# Patient Record
Sex: Male | Born: 1965 | Race: Black or African American | Hispanic: No | Marital: Single | Smoking: Current every day smoker
Health system: Southern US, Community
[De-identification: ages and names within clinical notes are randomized; demographics above are authoritative.]

## PROBLEM LIST (undated history)

## (undated) HISTORY — PX: FINGER AMPUTATION: SHX636

---

## 2000-08-31 ENCOUNTER — Encounter: Payer: Self-pay | Admitting: Emergency Medicine

## 2000-08-31 ENCOUNTER — Observation Stay (HOSPITAL_COMMUNITY): Admission: EM | Admit: 2000-08-31 | Discharge: 2000-08-31 | Payer: Self-pay | Admitting: Emergency Medicine

## 2003-12-18 ENCOUNTER — Emergency Department (HOSPITAL_COMMUNITY): Admission: EM | Admit: 2003-12-18 | Discharge: 2003-12-18 | Payer: Self-pay | Admitting: Emergency Medicine

## 2003-12-31 ENCOUNTER — Emergency Department (HOSPITAL_COMMUNITY): Admission: EM | Admit: 2003-12-31 | Discharge: 2003-12-31 | Payer: Self-pay | Admitting: Emergency Medicine

## 2004-05-19 ENCOUNTER — Emergency Department (HOSPITAL_COMMUNITY): Admission: EM | Admit: 2004-05-19 | Discharge: 2004-05-19 | Payer: Self-pay | Admitting: Emergency Medicine

## 2005-08-27 ENCOUNTER — Emergency Department (HOSPITAL_COMMUNITY): Admission: EM | Admit: 2005-08-27 | Discharge: 2005-08-27 | Payer: Self-pay | Admitting: Family Medicine

## 2006-01-31 ENCOUNTER — Emergency Department (HOSPITAL_COMMUNITY): Admission: EM | Admit: 2006-01-31 | Discharge: 2006-01-31 | Payer: Self-pay | Admitting: Family Medicine

## 2009-08-17 ENCOUNTER — Emergency Department (HOSPITAL_COMMUNITY): Admission: EM | Admit: 2009-08-17 | Discharge: 2009-08-17 | Payer: Self-pay | Admitting: Family Medicine

## 2010-08-25 NOTE — Op Note (Signed)
[place]. [facility]  Patient:    Jeremy Snow, Jeremy Snow                    MRN: [id number] Proc. Date: 08/31/00 Adm. 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  POSTOPERATIVE DIAGNOSIS:  Amputation right small finger at the P3/digital interphalangeal level.  OPERATION: 1. Incision and drainage skin, subcutaneous tissue, bone, and tendon    tissue, right small finger.  This was an excisional debridement with    removal of particulate matter. 2. Revision of amputation right small finger with bilateral neurectomies    and bony removal and contouring, etc.  SURGEON:  Aron Baba, M.D.  ASSISTANT:  None.  COMPLICATIONS:  None.  ANESTHESIA:  General.  TOURNIQUET TIME:  Less than 1 hour.  DRAINS:  None.  ESTIMATED BLOOD LOSS:  Less than 20 cc.  INDICATIONS FOR PROCEDURE:  This patient is a 45 year old male who presents with the above-mentioned diagnosis.  I have counselled him regarding risks and benefits of surgery including risk of infection, bleeding, anesthesia, damage to normal structures, and failure of surgery to accomplish intended goals of relieving symptoms and restoring function.  With this in mind, he desires to proceed.  All questions have been encouraged and answered preoperatively.  The patient and I have discussed his diagnosis.  At the present time, he had an amputated tip.  This is in very poor repair.  There is a large amount of grass, debris, and contaminants in the tip.  I have viewed the tip myself in the ER under 4.0 loupe magnification.  I see poor quality tissue, crush injury, and contusion to the vessels.  I did not think this was a viable candidate for replantation but will look at this in the operative suite under the microscope.  If it is deemed  salvageable for replantation, the patient does express desire to try this.  Thus, we will proceed with this completion/revision amputation versus reattachment as dictated by distal tip conditions.  I have discussed with the patient at length that I did feel that he will likely need an amputation as I feel the tip is very poor quality.  All questions have been encouraged and answered.  OPERATIVE FINDINGS:  This patient underwent revision of amputation due to the fact that the distal tip is not a suitable candidate for replantation.  The distal tip was contaminated, fragmented, and contused with crush injury to the vessels.  This was noted by the avulsion of the digital neurovascular bundles that were still somewhat attached to the proximal stump and certainly detached from the distal stump, leaving no vessels to tie into distally about the amputated part.  This was not in any way a suitable piece for replantation. It was examined under the microscope.  DESCRIPTION OF PROCEDURE IN DETAIL:  The patient was seen by myself in anesthesia, taken to the operating suite, and underwent smooth induction of anesthesia.  Appropriately padded and then prepped and draped in the usual sterile fashion.  Following this, I took the additional portion of the tip that was amputated and looked at it under the microscope.  It was debrided.  I examined the soft tissue and vessel  architecture.  The patient did not have any suitable vessels to tie into.  There was avulsion of the vessels, poor quality, dirty contaminants, and a crush injury throughout the distal tip. This was obviously not a candidate for replantation.  Following this, the patient had sterile field secured about the right upper extremity which had been prepped and draped with Betadine scrub and Betadine painting and sterile field secured with towels.  The right upper extremity underwent an I&D of skin and subcutaneous tissue and bone.  The I&D  was accomplished with scissor tip and knife blade.  I did remove the distal portion of P3.  There was a very small amount attached to the P3 bone.  The flexor tendon was debrided as well.  There was a large amount of grass, dirt, and soft tissue contaminants throughout the distal finger.  This was quite impressive.  He underwent meticulous debridement without difficulty including skin, subcutaneous tissue, bone, and tendinous structures.  Following this, the patient had removal of the distal P3 remnant.  Flexor tendon was pulled distally, severed, and allowed to retract proximally.  This was the FDP.  Following this, bilateral neurectomies were performed with crushing and cauterization technique, and the vessels were treated with cautery.  A defatting technique was performed and sculpting of the distal tip accomplished with knife blade and scissor tip under 4.0 loupe magnification. The patient tolerated this well.  The patient then had the wound copiously irrigated and tourniquet deflated.  I should not the copious irrigation was applied throughout the course of the operation and during the I&D.  With the I&D completed, the patient then underwent closure of the amputation.  Skin flaps were created so that there would not be any redundant skin edges, and the wound closed nicely.  There was good refill when the tourniquet was down. Following this, the patient had sterile compressive dressing placed without difficulty and a sterile wrap placed.  He tolerated the procedure well without difficulty.  I should note that we did try to remove off the terminal matrix attached to the distal finger so as to not grow a nail remnant; however, there is no guarantee that he might grow a portion of the nail remnant, etc.  The patient was awakened from anesthesia and transferred to the recovery room in stable condition.  All sponge, needle, and instrument counts were reported as correct, and there were no  complications.  I have discussed all findings with his family.  He will be discharged home on Keflex, Percocet, and Robaxin for pain and muscle spasm.  He will follow up in  10 to 14 days and notify us should any problems, questions, concerns, or abrupt changes occur in his examination. DD:  08/31/00 TD:  09/01/00 Job: [id number]

## 2012-04-02 ENCOUNTER — Emergency Department (HOSPITAL_COMMUNITY)
Admission: EM | Admit: 2012-04-02 | Discharge: 2012-04-02 | Disposition: A | Discharge status: Home / Self Care | Payer: Self-pay | Attending: Emergency Medicine | Admitting: Emergency Medicine

## 2012-04-02 ENCOUNTER — Emergency Department (HOSPITAL_COMMUNITY): Payer: Self-pay

## 2012-04-02 ENCOUNTER — Encounter (HOSPITAL_COMMUNITY): Payer: Self-pay

## 2012-04-02 DIAGNOSIS — M79609 Pain in unspecified limb: Secondary | ICD-10-CM | POA: Insufficient documentation

## 2012-04-02 DIAGNOSIS — M25449 Effusion, unspecified hand: Secondary | ICD-10-CM | POA: Insufficient documentation

## 2012-04-02 DIAGNOSIS — S68118A Complete traumatic metacarpophalangeal amputation of other finger, initial encounter: Secondary | ICD-10-CM | POA: Insufficient documentation

## 2012-04-02 DIAGNOSIS — M778 Other enthesopathies, not elsewhere classified: Secondary | ICD-10-CM

## 2012-04-02 DIAGNOSIS — F17200 Nicotine dependence, unspecified, uncomplicated: Secondary | ICD-10-CM | POA: Insufficient documentation

## 2012-04-02 DIAGNOSIS — M65849 Other synovitis and tenosynovitis, unspecified hand: Secondary | ICD-10-CM | POA: Insufficient documentation

## 2012-04-02 DIAGNOSIS — M65839 Other synovitis and tenosynovitis, unspecified forearm: Secondary | ICD-10-CM | POA: Insufficient documentation

## 2012-04-02 MED ORDER — MELOXICAM 7.5 MG PO TABS: 7.5000 mg | ORAL_TABLET | Freq: Every day | ORAL | Status: DC

## 2012-04-02 MED ORDER — IBUPROFEN 400 MG PO TABS
800.0000 mg | ORAL_TABLET | Freq: Once | ORAL | Status: AC
  Administered 2012-04-02: 800 mg via ORAL
  Filled 2012-04-02: qty 2

## 2012-04-02 NOTE — Progress Notes (Signed)
Orthopedic Tech Progress Note Patient Details:  Jeremy Snow [birth year] [id number]  Ortho Devices Type of Ortho Device: Velcro wrist splint Ortho Device/Splint Location: left wrist Ortho Device/Splint Interventions: Application   Mckinleigh Schuchart 04/02/2012, 12:30 PM

## 2012-04-02 NOTE — ED Notes (Signed)
Pt presents with L hand and wrist pain x 2 days.  Pt denies any injury, reports hand has been minimally swollen.  Pt is able to move digits and wrist but does so slightly due to pain.

## 2012-04-02 NOTE — ED Notes (Signed)
Ortho paged. 

## 2012-04-02 NOTE — ED Provider Notes (Addendum)
History  This chart was scribed for Jeremy Sprout, MD by Shari Heritage, ED Scribe. The patient was seen in room TR07C/TR07C. Patient's care was started at 1125.   CSN: [id number]  Arrival date & time 04/02/12  1117   First MD Initiated Contact with Patient 04/02/12 1125      Chief Complaint  Patient presents with  . Hand Pain  . Wrist Pain    Patient is a 46 y.o. male presenting with hand pain. The history is provided by the patient. No language interpreter was used.  Hand Pain This is a new problem. The current episode started 2 days ago. The problem occurs constantly. The problem has not changed since onset.Exacerbated by: ROM of wrist and hand. Nothing relieves the symptoms. He has tried nothing for the symptoms. The treatment provided no relief.    HPI Comments: Jeremy Snow is a 46 y.o. male who presents to the Emergency Department complaining of constant, non-radiating, dull, moderate left hand and wrist pain with associated swelling onset 2 days ago. Patient states that he woke up 2 days ago and with pain and swelling in his hand. Pain is worse with ROM of his wrist and fingers. He denies any obvious injury, trauma or fall. Patient denies numbness, weakness or tingling. Patient hasn't taken any pain medications for relief. Patient is a Curator. Patient reports no significant medical history.   History reviewed. No pertinent past medical history.  Past Surgical History  Procedure Date  . Finger amputation     No family history on file.  History  Substance Use Topics  . Smoking status: Current Every Day Smoker -- 0.5 packs/day    Types: Cigars  . Smokeless tobacco: Not on file  . Alcohol Use: Yes      Review of Systems A complete 10 system review of systems was obtained and all systems are negative except as noted in the HPI and PMH.   Allergies  Review of patient's allergies indicates no known allergies.  Home Medications  No current outpatient  prescriptions on file.  Triage Vitals: BP 105/73  Pulse 97  Temp 98 F (36.7 C) (Oral)  Resp 20  SpO2 97%  Physical Exam  Nursing note and vitals reviewed. Constitutional: He is oriented to person, place, and time. He appears well-developed and well-nourished.  HENT:  Head: Normocephalic and atraumatic.  Eyes: EOM are normal.  Neck: Neck supple.  Cardiovascular: Normal rate.   Pulmonary/Chest: Effort normal.  Abdominal: He exhibits no distension.  Musculoskeletal: He exhibits tenderness. He exhibits no edema.       Pain with flexion and extension of the wrist joint. Normal cap refill. Normal sensation in fingers and movement of his finger. No tenderness or swelling noted over the joint space of the wrist. 2+ radial pulse. No other tenderness noted.  No warmth or erythema.  Neurological: He is alert and oriented to person, place, and time.  Skin: Skin is warm and dry.  Psychiatric: He has a normal mood and affect. His behavior is normal.    ED Course  Procedures (including critical care time) DIAGNOSTIC STUDIES: Oxygen Saturation is 97% on room air, adequate by my interpretation.    COORDINATION OF CARE: 11:35 AM- Patient informed of current plan for treatment and evaluation and agrees with plan at this time.      Labs Reviewed - No data to display Dg Wrist Complete Left  04/02/2012  *RADIOLOGY REPORT*  Clinical Data: Left wrist pain, no trauma  LEFT  WRIST - COMPLETE 3+ VIEW  Comparison:  None.  Findings:  There is no evidence of fracture or dislocation.  There is no evidence of arthropathy or other focal bone abnormality. Soft tissues are unremarkable.  IMPRESSION: Negative.   Original Report Authenticated By: Christiana Pellant, M.D.      1. Tendonitis of wrist, left       MDM   Patient with wrist pain that started 2 days ago with no notable injury. On exam he explains that his hands and wrists hurt however with further evaluation the pain is localized to flexion and  extension of the wrist. He has no symptoms suggestive of a septic joint or gout. 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 Feel most likely the patient has tendinitis however will do a plain films to ensure no bony abnormalities. Patient has taken nothing for his pain so he was given ibuprofen.  12:20 PM   Patient was given a Velcro wrist splints and plain films are within normal limits. He was started on meloxicam and told to use ice. He was given followup to hand if symptoms do not resolve in one week      I personally performed the services described in this documentation, which was scribed in my presence.  The recorded information has been reviewed and considered.    Jeremy Sprout, MD 04/02/12 1143  Jeremy Sprout, MD 04/02/12 4098  Jeremy Sprout, MD 04/02/12 1191

## 2012-04-02 NOTE — ED Notes (Signed)
Ortho returned page  

## 2013-06-19 ENCOUNTER — Emergency Department (HOSPITAL_COMMUNITY)
Admission: EM | Admit: 2013-06-19 | Discharge: 2013-06-19 | Disposition: A | Discharge status: Home / Self Care | Payer: BC Managed Care – PPO | Source: Home / Self Care | Attending: Emergency Medicine | Admitting: Emergency Medicine

## 2013-06-19 ENCOUNTER — Encounter (HOSPITAL_COMMUNITY): Payer: Self-pay | Admitting: Emergency Medicine

## 2013-06-19 DIAGNOSIS — K120 Recurrent oral aphthae: Secondary | ICD-10-CM

## 2013-06-19 MED ORDER — HYDROCODONE-ACETAMINOPHEN 5-325 MG PO TABS
ORAL_TABLET | ORAL | Status: AC
  Filled 2013-06-19: qty 2

## 2013-06-19 MED ORDER — HYDROCODONE-ACETAMINOPHEN 5-325 MG PO TABS: ORAL_TABLET | ORAL | Status: DC

## 2013-06-19 MED ORDER — METRONIDAZOLE 500 MG PO TABS: 500.0000 mg | ORAL_TABLET | Freq: Three times a day (TID) | ORAL | Status: DC

## 2013-06-19 MED ORDER — AMOXICILLIN 500 MG PO CAPS: 500.0000 mg | ORAL_CAPSULE | Freq: Three times a day (TID) | ORAL | Status: DC

## 2013-06-19 MED ORDER — TRIAMCINOLONE ACETONIDE 0.1 % MT PSTE: 1.0000 "application " | PASTE | Freq: Two times a day (BID) | OROMUCOSAL | Status: DC

## 2013-06-19 MED ORDER — HYDROCODONE-ACETAMINOPHEN 5-325 MG PO TABS
2.0000 | ORAL_TABLET | Freq: Once | ORAL | Status: AC
  Administered 2013-06-19: 2 via ORAL

## 2013-06-19 NOTE — Discharge Instructions (Signed)
Canker Sores  Canker sores are painful, open sores on the inside of the mouth and cheek. They may be white or yellow. The sores usually heal in 1 to 2 weeks. Women are more likely than men to have recurrent canker sores. CAUSES The cause of canker sores is not well understood. More than one cause is likely. Canker sores do not appear to be caused by certain types of germs (viruses or bacteria). Canker sores may be caused by:  An allergic reaction to certain foods.  Digestive problems.  Not having enough vitamin B12, folic acid, and iron.  Male sex hormones. Sores may come only during certain phases of a menstrual cycle. Often, there is improvement during pregnancy.  Genetics. Some people seem to inherit canker sore problems. Emotional stress and injuries to the mouth may trigger outbreaks, but not cause them.  DIAGNOSIS Canker sores are diagnosed by exam.  TREATMENT  Patients who have frequent bouts of canker sores may have cultures taken of the sores, blood tests, or allergy tests. This helps determine if their sores are caused by a poor diet, an allergy, or some other preventable or treatable disease.  Vitamins may prevent recurrences or reduce the severity of canker sores in people with poor nutrition.  Numbing ointments can relieve pain. These are available in drug stores without a prescription.  Anti-inflammatory steroid mouth rinses or gels may be prescribed by your caregiver for severe sores.  Oral steroids may be prescribed if you have severe, recurrent canker sores. These strong medicines can cause many side effects and should be used only under the close direction of a dentist or physician.  Mouth rinses containing the antibiotic medicine may be prescribed. They may lessen symptoms and speed healing. Healing usually happens in about 1 or 2 weeks with or without treatment. Certain antibiotic mouth rinses given to pregnant women and young children can permanently stain teeth.  Talk to your caregiver about your treatment. HOME CARE INSTRUCTIONS   Avoid foods that cause canker sores for you.  Avoid citrus juices, spicy or salty foods, and coffee until the sores are healed.  Use a soft-bristled toothbrush.  Chew your food carefully to avoid biting your cheek.  Apply topical numbing medicine to the sore to help relieve pain.  Apply a thin paste of baking soda and water to the sore to help heal the sore.  Only use mouth rinses or medicines for pain or discomfort as directed by your caregiver. SEEK MEDICAL CARE IF:   Your symptoms are not better in 1 week.  Your sores are still present after 2 weeks.  Your sores are very painful.  You have trouble breathing or swallowing.  Your sores come back frequently. Document Released: 07/21/2010 Document Revised: 07/21/2012 Document Reviewed: 07/21/2010 [employer] Patient Information 2014 [employer].  

## 2013-06-19 NOTE — ED Notes (Signed)
C/O right cheek swelling, right upper and lower gum swelling since yesterday with severe pain.  Denies any toothache.  Denies fevers.  Has not taken any measures to help alleviate pain.
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
## 2013-06-19 NOTE — ED Provider Notes (Signed)
Chief Complaint   Chief Complaint  Patient presents with  . Oral Swelling    History of Present Illness   Jeremy Snow is a 48 year old male who has had a two-day history of pain and swelling of the gingiva of the right upper and lower dentition. He has trouble opening his mouth. This gives him somewhat of a headache. He denies any fever or chills. He denies any swelling of the neck or difficulty breathing or swallowing.  Review of Systems   Other than as noted above, the patient denies any of the following symptoms: Systemic:  No fever, chills,  Or sweats. ENT:  No headache, ear ache, sore throat, nasal congestion, facial pain, or swelling. Lymphatic:  No adenopathy. Lungs:  No coughing, wheezing or shortness of breath.  PMFSH   Past medical history, family history, social history, meds, and allergies were reviewed.   Physical Examination     Vital signs:  BP 124/89  Pulse 85  Temp(Src) 99 F (37.2 C) (Oral)  Resp 18  SpO2 96% General:  Alert, oriented, in no distress. ENT:  TMs and canals normal.  Nasal mucosa normal. Mouth exam:  His teeth were in surprisingly good repair. He has all his teeth including wisdom teeth. In the area where he's complaining of pain, the retromolar trigone, there is a shallow ulcer in this area and some inflammation. There is some inflammation of the gingiva, but not severe. There no other intraoral ulcers. No obvious dental decay. No pain to palpation of the teeth. The pharynx is clear and the airway is widely patent. There is no swelling of the floor the mouth or the tongue. Neck:  No swelling or adenopathy. Lungs:  Breath sounds clear and equal bilaterally.  No wheezes, rales or rhonchi. Heart:  Regular rhythm.  No gallops or murmers. Skin:  Clear, warm and dry.   Course in Ur[facility] Center   Given Norco 5/325 2 by mouth for pain.  Assessment   The encounter diagnosis was Aphthous ulcer.  Differential diagnosis is aphthous  ulcer, wisdom tooth pain, or gingivitis. Suggested that he followup with ENT if symptoms persist. There is no evidence of dental decay or a dental abscess.  Plan   1.  Meds:  The following meds were prescribed:   Discharge Medication List as of 06/19/2013  8:28 PM    START taking these medications   Details  amoxicillin (AMOXIL) 500 MG capsule Take 1 capsule (500 mg total) by mouth 3 (three) times daily., Starting 06/19/2013, Until Discontinued, Normal    HYDROcodone-acetaminophen (NORCO/VICODIN) 5-325 MG per tablet 1 to 2 tabs every 4 to 6 hours as needed for pain., Print    metroNIDAZOLE (FLAGYL) 500 MG tablet Take 1 tablet (500 mg total) by mouth 3 (three) times daily., Starting 06/19/2013, Until Discontinued, Normal    triamcinolone (KENALOG) 0.1 % paste Use as directed 1 application in the mouth or throat 2 (two) times daily., Starting 06/19/2013, Until Discontinued, Normal        2.  Patient Education/Counseling:  The patient was given appropriate handouts, self care instructions, and instructed in symptomatic relief. Suggested sleeping with head of bed elevated and hot salt water mouthwash.   3.  Follow up:  The patient was told to follow up if no better in 3 to 4 days, if becoming worse in any way, and given some red flag symptoms such as difficulty swallowing or breathing which would prompt immediate return.  Follow up with a dentist  or ENT if symptoms persist. I cannot see any definite dental decay.     Reuben Likesavid C Dacari Beckstrand, MD 06/19/13 2135

## 2014-12-03 ENCOUNTER — Emergency Department (INDEPENDENT_AMBULATORY_CARE_PROVIDER_SITE_OTHER): Payer: BLUE CROSS/BLUE SHIELD

## 2014-12-03 ENCOUNTER — Encounter (HOSPITAL_COMMUNITY): Payer: Self-pay | Admitting: *Deleted

## 2014-12-03 ENCOUNTER — Emergency Department (HOSPITAL_COMMUNITY)
Admission: EM | Admit: 2014-12-03 | Discharge: 2014-12-03 | Disposition: A | Discharge status: Home / Self Care | Payer: BLUE CROSS/BLUE SHIELD | Source: Home / Self Care | Attending: Family Medicine | Admitting: Family Medicine

## 2014-12-03 DIAGNOSIS — S60512A Abrasion of left hand, initial encounter: Secondary | ICD-10-CM

## 2014-12-03 MED ORDER — BACITRACIN ZINC 500 UNIT/GM EX OINT
TOPICAL_OINTMENT | CUTANEOUS | Status: AC
  Filled 2014-12-03: qty 2.7

## 2014-12-03 NOTE — Discharge Instructions (Signed)
Wash scrapes regularly, soak hand for swelling, advil for soreness. Return as needed.
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
## 2015-12-05 ENCOUNTER — Encounter (HOSPITAL_COMMUNITY): Payer: Self-pay | Admitting: Emergency Medicine

## 2015-12-05 ENCOUNTER — Ambulatory Visit (HOSPITAL_COMMUNITY)
Admission: EM | Admit: 2015-12-05 | Discharge: 2015-12-05 | Disposition: A | Discharge status: Home / Self Care | Payer: BLUE CROSS/BLUE SHIELD | Attending: Internal Medicine | Admitting: Internal Medicine

## 2015-12-05 ENCOUNTER — Ambulatory Visit (INDEPENDENT_AMBULATORY_CARE_PROVIDER_SITE_OTHER): Payer: BLUE CROSS/BLUE SHIELD

## 2015-12-05 DIAGNOSIS — M545 Low back pain: Secondary | ICD-10-CM

## 2015-12-05 MED ORDER — HYDROCODONE-ACETAMINOPHEN 10-325 MG PO TABS: 1.0000 | ORAL_TABLET | Freq: Four times a day (QID) | ORAL | 0 refills | Status: DC | PRN

## 2015-12-05 MED ORDER — CYCLOBENZAPRINE HCL 10 MG PO TABS: 10.0000 mg | ORAL_TABLET | Freq: Two times a day (BID) | ORAL | 0 refills | Status: DC | PRN

## 2015-12-05 MED ORDER — PREDNISONE 10 MG PO TABS: 20.0000 mg | ORAL_TABLET | Freq: Every day | ORAL | 0 refills | Status: DC
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
## 2015-12-05 NOTE — ED Triage Notes (Signed)
[facility] a [facility] of recurrent low back pain that has been worse the last 3 days. The patient denied any known injury.

## 2015-12-17 ENCOUNTER — Encounter (HOSPITAL_COMMUNITY): Payer: Self-pay | Admitting: *Deleted

## 2015-12-17 ENCOUNTER — Encounter (HOSPITAL_COMMUNITY): Payer: Self-pay | Admitting: Emergency Medicine

## 2015-12-17 ENCOUNTER — Emergency Department (HOSPITAL_COMMUNITY)
Admission: EM | Admit: 2015-12-17 | Discharge: 2015-12-17 | Disposition: A | Discharge status: Home / Self Care | Payer: BLUE CROSS/BLUE SHIELD | Attending: Emergency Medicine | Admitting: Emergency Medicine

## 2015-12-17 ENCOUNTER — Emergency Department (HOSPITAL_COMMUNITY): Payer: BLUE CROSS/BLUE SHIELD

## 2015-12-17 ENCOUNTER — Ambulatory Visit (HOSPITAL_COMMUNITY)
Admission: EM | Admit: 2015-12-17 | Discharge: 2015-12-17 | Disposition: A | Discharge status: Home / Self Care | Payer: BLUE CROSS/BLUE SHIELD | Attending: Internal Medicine | Admitting: Internal Medicine

## 2015-12-17 DIAGNOSIS — M545 Low back pain, unspecified: Secondary | ICD-10-CM

## 2015-12-17 DIAGNOSIS — R1012 Left upper quadrant pain: Secondary | ICD-10-CM

## 2015-12-17 DIAGNOSIS — R109 Unspecified abdominal pain: Secondary | ICD-10-CM

## 2015-12-17 DIAGNOSIS — F17210 Nicotine dependence, cigarettes, uncomplicated: Secondary | ICD-10-CM | POA: Insufficient documentation

## 2015-12-17 LAB — CBC
HEMATOCRIT: 50.4 % (ref 39.0–52.0)
HEMOGLOBIN: 16.2 g/dL (ref 13.0–17.0)
MCH: 31.9 pg (ref 26.0–34.0)
MCHC: 32.1 g/dL (ref 30.0–36.0)
MCV: 99.2 fL (ref 78.0–100.0)
Platelets: 281 10*3/uL (ref 150–400)
RBC: 5.08 MIL/uL (ref 4.22–5.81)
RDW: 13.5 % (ref 11.5–15.5)
WBC: 12 10*3/uL — ABNORMAL HIGH (ref 4.0–10.5)

## 2015-12-17 LAB — COMPREHENSIVE METABOLIC PANEL
ALBUMIN: 3.8 g/dL (ref 3.5–5.0)
ALT: 38 U/L (ref 17–63)
ANION GAP: 8 (ref 5–15)
AST: 22 U/L (ref 15–41)
Alkaline Phosphatase: 54 U/L (ref 38–126)
BUN: 14 mg/dL (ref 6–20)
CO2: 24 mmol/L (ref 22–32)
Calcium: 8.7 mg/dL — ABNORMAL LOW (ref 8.9–10.3)
Chloride: 104 mmol/L (ref 101–111)
Creatinine, Ser: 1.05 mg/dL (ref 0.61–1.24)
GFR calc Af Amer: >60 mL/min (ref >60)
GFR calc non Af Amer: >60 mL/min (ref >60)
GLUCOSE: 82 mg/dL (ref 65–99)
POTASSIUM: 4.3 mmol/L (ref 3.5–5.1)
SODIUM: 136 mmol/L (ref 135–145)
Total Bilirubin: 0.8 mg/dL (ref 0.3–1.2)
Total Protein: 6.5 g/dL (ref 6.5–8.1)

## 2015-12-17 LAB — URINALYSIS, ROUTINE W REFLEX MICROSCOPIC
BILIRUBIN URINE: NEGATIVE
Glucose, UA: NEGATIVE mg/dL
Hgb urine dipstick: NEGATIVE
Ketones, ur: NEGATIVE mg/dL
Leukocytes, UA: NEGATIVE
NITRITE: NEGATIVE
PH: 5.5 (ref 5.0–8.0)
Protein, ur: NEGATIVE mg/dL
SPECIFIC GRAVITY, URINE: 1.015 (ref 1.005–1.030)

## 2015-12-17 LAB — POCT URINALYSIS DIP (DEVICE)
Bilirubin Urine: NEGATIVE
GLUCOSE, UA: NEGATIVE mg/dL
Ketones, ur: NEGATIVE mg/dL
NITRITE: NEGATIVE
Protein, ur: NEGATIVE mg/dL
Specific Gravity, Urine: 1.01 (ref 1.005–1.030)
UROBILINOGEN UA: 0.2 mg/dL (ref 0.0–1.0)
pH: 5.5 (ref 5.0–8.0)

## 2015-12-17 LAB — LIPASE, BLOOD: LIPASE: 23 U/L (ref 11–51)

## 2015-12-17 MED ORDER — TRAMADOL HCL 50 MG PO TABS
50.0000 mg | ORAL_TABLET | Freq: Once | ORAL | Status: AC
  Administered 2015-12-17: 50 mg via ORAL
  Filled 2015-12-17: qty 1

## 2015-12-17 NOTE — Discharge Instructions (Signed)
It was our pleasure to provide your ER care today - we hope that you feel better.  Take motrin or aleve as need for pain.  You may also take your pain medication as need.  Follow up with primary care doctor in the coming week.  Return to ER if worse, new symptoms, fevers, intractable pain, numbness/weakness, other concern.  You were given pain medication in the ER - no driving for the next 4 hours.

## 2015-12-17 NOTE — ED Provider Notes (Signed)
[facility]-EMERGENCY DEPT Provider Note   CSN: [id number] Arrival date & time: 12/17/15  1539     History   Chief Complaint Chief Complaint  Patient presents with  . Flank Pain    HPI JERAMINE DELIS is a 50 y.o. male.  Patient c/o left flank pain, onset 1 week ago. Pain dull, moderate, constant, persistent. Pain is worse w certain movements, position changes. Feels pain posteriorly, and radiates towards right groin. No testicle pain. Denies dysuria or hematuria. Denies hx same pain. Denies back injury or strain. No hx ddd. Denies  Hx kidney stone. No fever or chills.     Flank Pain  Pertinent negatives include no chest pain, no abdominal pain, no headaches and no shortness of breath.    History reviewed. No pertinent past medical history.  There are no active problems to display for this patient.   Past Surgical History:  Procedure Laterality Date  . FINGER AMPUTATION         Home Medications    Prior to Admission medications   Medication Sig Start Date End Date Taking? Authorizing Provider  amoxicillin (AMOXIL) 500 MG capsule Take 1 capsule (500 mg total) by mouth 3 (three) times daily. 06/19/13   Reuben Likes, MD  cyclobenzaprine (FLEXERIL) 10 MG tablet Take 1 tablet (10 mg total) by mouth 2 (two) times daily as needed for muscle spasms. 12/05/15   Tharon Aquas, PA  HYDROcodone-acetaminophen (NORCO) 10-325 MG tablet Take 1 tablet by mouth every 6 (six) hours as needed. 12/05/15   Tharon Aquas, PA  HYDROcodone-acetaminophen (NORCO/VICODIN) 5-325 MG per tablet 1 to 2 tabs every 4 to 6 hours as needed for pain. 06/19/13   Reuben Likes, MD  meloxicam (MOBIC) 7.5 MG tablet Take 1 tablet (7.5 mg total) by mouth daily. 04/02/12   Gwyneth Sprout, MD  metroNIDAZOLE (FLAGYL) 500 MG tablet Take 1 tablet (500 mg total) by mouth 3 (three) times daily. 06/19/13   Reuben Likes, MD  predniSONE (DELTASONE) 10 MG tablet Take 2 tablets (20 mg total) by mouth daily. 12/05/15    Tharon Aquas, PA  triamcinolone (KENALOG) 0.1 % paste Use as directed 1 application in the mouth or throat 2 (two) times daily. 06/19/13   Reuben Likes, MD    Family History No family history on file.  Social History Social History  Substance Use Topics  . Smoking status: Current Every Day Smoker    Packs/day: 0.50    Types: Cigars  . Smokeless tobacco: Never Used  . Alcohol use No     Allergies   Review of patient's allergies indicates no known allergies.   Review of Systems Review of Systems  Constitutional: Negative for fever.  HENT: Negative for sore throat.   Eyes: Negative for redness.  Respiratory: Negative for shortness of breath.   Cardiovascular: Negative for chest pain.  Gastrointestinal: Negative for abdominal pain.  Genitourinary: Positive for flank pain. Negative for scrotal swelling and testicular pain.  Musculoskeletal: Positive for back pain. Negative for neck pain.  Skin: Negative for rash.  Neurological: Negative for headaches.  Hematological: Does not bruise/bleed easily.  Psychiatric/Behavioral: Negative for confusion.     Physical Exam Updated Vital Signs BP 111/90   Pulse 99   Temp 98.2 F (36.8 C) (Oral)   Resp 20   Ht 5\' 2"  (1.575 m)   Wt 69.4 kg   SpO2 96%   BMI 27.98 kg/m   Physical Exam  Constitutional: He appears  well-developed and well-nourished. No distress.  HENT:  Head: Atraumatic.  Eyes: Conjunctivae are normal.  Neck: Neck supple. No tracheal deviation present.  Cardiovascular: Normal rate, regular rhythm, normal heart sounds and intact distal pulses.   Pulmonary/Chest: Effort normal and breath sounds normal. No accessory muscle usage. No respiratory distress.  Abdominal: Soft. Bowel sounds are normal. He exhibits no distension and no mass. There is no tenderness. There is no rebound and no guarding. No hernia.  Genitourinary:  Genitourinary Comments: No cva tenderness  Musculoskeletal: Normal range of motion.  TLS  spine non tender, aligned.   Neurological: He is alert.  Motor intact bil, stre 5/5. sens grossly intact. Steady gait.   Skin: Skin is warm and dry. No rash noted. He is not diaphoretic.  No shingles/rash to area of pain.   Psychiatric: He has a normal mood and affect.  Nursing note and vitals reviewed.    ED Treatments / Results  Labs (all labs ordered are listed, but only abnormal results are displayed) Results for orders placed or performed during the hospital encounter of 12/17/15  Lipase, blood  Result Value Ref Range   Lipase 23 11 - 51 U/L  Comprehensive metabolic panel  Result Value Ref Range   Sodium 136 135 - 145 mmol/L   Potassium 4.3 3.5 - 5.1 mmol/L   Chloride 104 101 - 111 mmol/L   CO2 24 22 - 32 mmol/L   Glucose, Bld 82 65 - 99 mg/dL   BUN 14 6 - 20 mg/dL   Creatinine, Ser 2.95 0.61 - 1.24 mg/dL   Calcium 8.7 (L) 8.9 - 10.3 mg/dL   Total Protein 6.5 6.5 - 8.1 g/dL   Albumin 3.8 3.5 - 5.0 g/dL   AST 22 15 - 41 U/L   ALT 38 17 - 63 U/L   Alkaline Phosphatase 54 38 - 126 U/L   Total Bilirubin 0.8 0.3 - 1.2 mg/dL   GFR calc non Af Amer >60 >60 mL/min   GFR calc Af Amer >60 >60 mL/min   Anion gap 8 5 - 15  CBC  Result Value Ref Range   WBC 12.0 (H) 4.0 - 10.5 K/uL   RBC 5.08 4.22 - 5.81 MIL/uL   Hemoglobin 16.2 13.0 - 17.0 g/dL   HCT 62.1 30.8 - 65.7 %   MCV 99.2 78.0 - 100.0 fL   MCH 31.9 26.0 - 34.0 pg   MCHC 32.1 30.0 - 36.0 g/dL   RDW 84.6 96.2 - 95.2 %   Platelets 281 150 - 400 K/uL  Urinalysis, Routine w reflex microscopic  Result Value Ref Range   Color, Urine YELLOW YELLOW   APPearance CLEAR CLEAR   Specific Gravity, Urine 1.015 1.005 - 1.030   pH 5.5 5.0 - 8.0   Glucose, UA NEGATIVE NEGATIVE mg/dL   Hgb urine dipstick NEGATIVE NEGATIVE   Bilirubin Urine NEGATIVE NEGATIVE   Ketones, ur NEGATIVE NEGATIVE mg/dL   Protein, ur NEGATIVE NEGATIVE mg/dL   Nitrite NEGATIVE NEGATIVE   Leukocytes, UA NEGATIVE NEGATIVE   Dg Lumbar Spine  Complete  Result Date: 12/05/2015 CLINICAL DATA:  Acute lower back pain without known injury. EXAM: LUMBAR SPINE - COMPLETE 4+ VIEW COMPARISON:  None. FINDINGS: No fracture or spondylolisthesis is noted. Mild degenerative disc disease is noted at L2-3 and L3-4 with anterior osteophyte formation. Posterior facet joints appear normal. IMPRESSION: Mild multilevel degenerative disc disease. No acute abnormality seen in the lumbar spine. Electronically Signed   By: Lupita Raider,  M.D.   On: 12/05/2015 13:49   Ct Renal Stone Study  Result Date: 12/17/2015 CLINICAL DATA:  50 year old male with left flank pain radiating into the lower back. EXAM: CT ABDOMEN AND PELVIS WITHOUT CONTRAST TECHNIQUE: Multidetector CT imaging of the abdomen and pelvis was performed following the standard protocol without IV contrast. COMPARISON:  None. FINDINGS: Evaluation of this exam is limited in the absence of intravenous contrast. Minimal bibasilar linear atelectatic/scar with the visualized lung bases are otherwise clear no intra-abdominal free air or free fluid. The liver, gallbladder, pancreas, Find and the right adrenal gland appear unremarkable. Indeterminate 3 mm left adrenal nodule, most likely adenoma. The kidneys, visualized ureters, and urinary bladder appear unremarkable. Minimal left perinephric stranding, nonspecific. Correlation with urinalysis recommended to exclude UTI. The prostate and seminal vesicles are grossly unremarkable. Minimal thickened appearance of the jejunal loops in the left hemiabdomen. Evaluation however is limited in the absence of contrast. Clinical correlation is recommended to exclude enteritis. Scattered colonic diverticula without active inflammatory changes. There is moderate stool throughout the colon. There is no evidence of bowel obstruction. Normal appendix. The abdominal aorta and IVC appear unremarkable on this noncontrast study. No portal venous gas identified. There is no adenopathy.  There is diastases of anterior abdominal wall musculature with small broad-based umbilical hernia contain a short segment small bowel. There is no evidence of obstruction or strangulation. The abdominal wall soft tissues are otherwise unremarkable. There is degenerative changes of spine. No acute fracture or dislocation. IMPRESSION: No hydronephrosis or nephrolithiasis. Correlation with urinalysis recommended to exclude UTI. Colonic diverticulosis. Minimal thickened appearance of the jejunal loops in the left hemiabdomen. Enteritis is less likely. Clinical correlation is recommended. No bowel obstruction. Electronically Signed   By: Elgie CollardArash  Radparvar M.D.   On: 12/17/2015 20:55    EKG  EKG Interpretation None       Radiology No results found.  Procedures Procedures (including critical care time)  Medications Ordered in ED Medications  traMADol (ULTRAM) tablet 50 mg (not administered)     Initial Impression / Assessment and Plan / ED Course  I have reviewed the triage vital signs and the nursing notes.  Pertinent labs & imaging results that were available during my care of the patient were reviewed by me and considered in my medical decision making (see chart for details).  Clinical Course    Labs. Imaging.   No meds prior to arrival.  Pts spouse drove him.  Ultram po.  Ct abd neg acute. No abd tenderness on exam. Deg changes in spine noted, ?nerve impingement/ddd as cause of symptoms, esp as pts symptoms, worse w positional changes/movement do appears msk in nature.   Afeb. Pt comfortable on recheck.   Patient currently appears stable for d/c.      Final Clinical Impressions(s) / ED Diagnoses   Final diagnoses:  None    New Prescriptions New Prescriptions   No medications on file     Cathren LaineKevin Neshawn Aird, MD 12/17/15 2215

## 2015-12-17 NOTE — ED Triage Notes (Signed)
Pt c/o persistent back pain onset x2 weeks.... Reports he was seen here on 8/28 for similar sx  Reports pain radiates to LLQ and down to the groin area and left   Slow gait... Cane upon arrival.... A&O x4... NAD... Mother at bedside.

## 2015-12-17 NOTE — ED Notes (Signed)
Pt understood dc material. NAD noted. 

## 2015-12-17 NOTE — ED Notes (Signed)
Pt. Is being transfeerred to ED

## 2015-12-17 NOTE — ED Notes (Signed)
Family at bedside. 

## 2015-12-17 NOTE — ED Notes (Signed)
Patient transported to CT 

## 2015-12-17 NOTE — ED Provider Notes (Signed)
CSN: [id number]     Arrival date & time 12/17/15  1225 History   First MD Initiated Contact with Patient 12/17/15 1450     Chief Complaint  Patient presents with  . Back Pain   (Consider location/radiation/quality/duration/timing/severity/associated sxs/prior Treatment)  HPI   The patient is a 50 year old male presenting today with left flank pain that is radiating from his left lower back around to his left testicular groin area. Patient describes the pain is severe. He reports having difficulty walking and is using a cane due to discomfort. Patient denies hematuria or dysuria or history of kidney stone. Patient states was seen here approximately week and a half ago and diagnosed with lower back pain. Patient reports his left leg is feeling "different", but does not describe it is numb. Denies saddle anesthesia or difficulty with bowel or bladder movements.  History reviewed. No pertinent past medical history. Past Surgical History:  Procedure Laterality Date  . FINGER AMPUTATION     History reviewed. No pertinent family history. Social History  Substance Use Topics  . Smoking status: Current Every Day Smoker    Packs/day: 0.50    Types: Cigars  . Smokeless tobacco: Never Used  . Alcohol use No    Review of Systems  Constitutional: Negative.  Negative for fever.  HENT: Negative.   Eyes: Negative.   Respiratory: Negative.  Negative for cough and shortness of breath.   Cardiovascular: Negative.   Gastrointestinal: Positive for abdominal pain. Negative for diarrhea, nausea and vomiting.  Endocrine: Negative.   Genitourinary: Positive for flank pain and testicular pain. Negative for difficulty urinating, dysuria, frequency, genital sores, penile pain, scrotal swelling and urgency.  Musculoskeletal: Positive for back pain. Negative for neck pain and neck stiffness.  Skin: Negative.   Allergic/Immunologic: Negative.   Neurological: Negative.   Hematological: Negative.    Psychiatric/Behavioral: Negative.     Allergies  Review of patient's allergies indicates no known allergies.  Home Medications   Prior to Admission medications   Medication Sig Start Date End Date Taking? Authorizing Provider  cyclobenzaprine (FLEXERIL) 10 MG tablet Take 1 tablet (10 mg total) by mouth 2 (two) times daily as needed for muscle spasms. 12/05/15  Yes Tharon Aquas, PA  HYDROcodone-acetaminophen (NORCO) 10-325 MG tablet Take 1 tablet by mouth every 6 (six) hours as needed. 12/05/15  Yes Tharon Aquas, PA  amoxicillin (AMOXIL) 500 MG capsule Take 1 capsule (500 mg total) by mouth 3 (three) times daily. 06/19/13   Reuben Likes, MD  HYDROcodone-acetaminophen (NORCO/VICODIN) 5-325 MG per tablet 1 to 2 tabs every 4 to 6 hours as needed for pain. 06/19/13   Reuben Likes, MD  meloxicam (MOBIC) 7.5 MG tablet Take 1 tablet (7.5 mg total) by mouth daily. 04/02/12   Gwyneth Sprout, MD  metroNIDAZOLE (FLAGYL) 500 MG tablet Take 1 tablet (500 mg total) by mouth 3 (three) times daily. 06/19/13   Reuben Likes, MD  predniSONE (DELTASONE) 10 MG tablet Take 2 tablets (20 mg total) by mouth daily. 12/05/15   Tharon Aquas, PA  triamcinolone (KENALOG) 0.1 % paste Use as directed 1 application in the mouth or throat 2 (two) times daily. 06/19/13   Reuben Likes, MD   Meds Ordered and Administered this Visit  Medications - No data to display  BP 103/66 (BP Location: Left Arm)   Pulse 109   Temp 99 F (37.2 C) (Oral)   Resp 12   SpO2 99%  No data found.  Physical Exam  Constitutional: He appears well-developed and well-nourished. No distress.  Cardiovascular: Normal rate, regular rhythm, normal heart sounds and intact distal pulses.  Exam reveals no gallop and no friction rub.   No murmur heard. Pulmonary/Chest: Effort normal and breath sounds normal. No respiratory distress. He has no wheezes. He has no rales. He exhibits no tenderness.  Abdominal: Soft. Bowel sounds are normal.  He exhibits no distension and no mass. There is no tenderness. There is no rebound and no guarding.  Positive CVA tenderness on left.  Genitourinary: Penis normal. No penile tenderness.  Genitourinary Comments: No evidence of scrotal swelling or distention. Cremasteric reflexes are present bilaterally.  Skin: Skin is warm and dry. He is not diaphoretic.  Nursing note and vitals reviewed.   Urgent Care Course   Clinical Course    Procedures (including critical care time)  Labs Review Labs Reviewed  POCT URINALYSIS DIP (DEVICE) - Abnormal; Notable for the following:       Result Value   Hgb urine dipstick TRACE (*)    Leukocytes, UA TRACE (*)    All other components within normal limits  URINALYSIS, DIPSTICK ONLY   Results for orders placed or performed during the hospital encounter of 12/17/15  POCT urinalysis dip (device)  Result Value Ref Range   Glucose, UA NEGATIVE NEGATIVE mg/dL   Bilirubin Urine NEGATIVE NEGATIVE   Ketones, ur NEGATIVE NEGATIVE mg/dL   Specific Gravity, Urine 1.010 1.005 - 1.030   Hgb urine dipstick TRACE (A) NEGATIVE   pH 5.5 5.0 - 8.0   Protein, ur NEGATIVE NEGATIVE mg/dL   Urobilinogen, UA 0.2 0.0 - 1.0 mg/dL   Nitrite NEGATIVE NEGATIVE   Leukocytes, UA TRACE (A) NEGATIVE     Imaging Review No results found.   MDM   1. Acute left flank pain    Discussed plan of care with Dr. Dayton ScrapeMurray.  [facility] for further evaluation of flank pain and abnormal UA, r/o kidney stone.  The usual and customary discharge instructions and warnings were given.  The patient verbalizes understanding and agrees to plan of care.        Servando Salinaatherine H Ivin Rosenbloom, NP 12/17/15 1529

## 2015-12-17 NOTE — ED Triage Notes (Signed)
The pt is c/o lt flank pain down to his llq for two weeks  He also has some tightness in lt groin  [facility]

## 2018-07-10 IMAGING — DX DG LUMBAR SPINE COMPLETE 4+V
5 series · 5 of 5 positions shown · non-contrast
Comparison: None.

CLINICAL DATA: Acute lower back pain without known injury.

EXAM:
LUMBAR SPINE - COMPLETE 4+ VIEW

[l-spine ap]
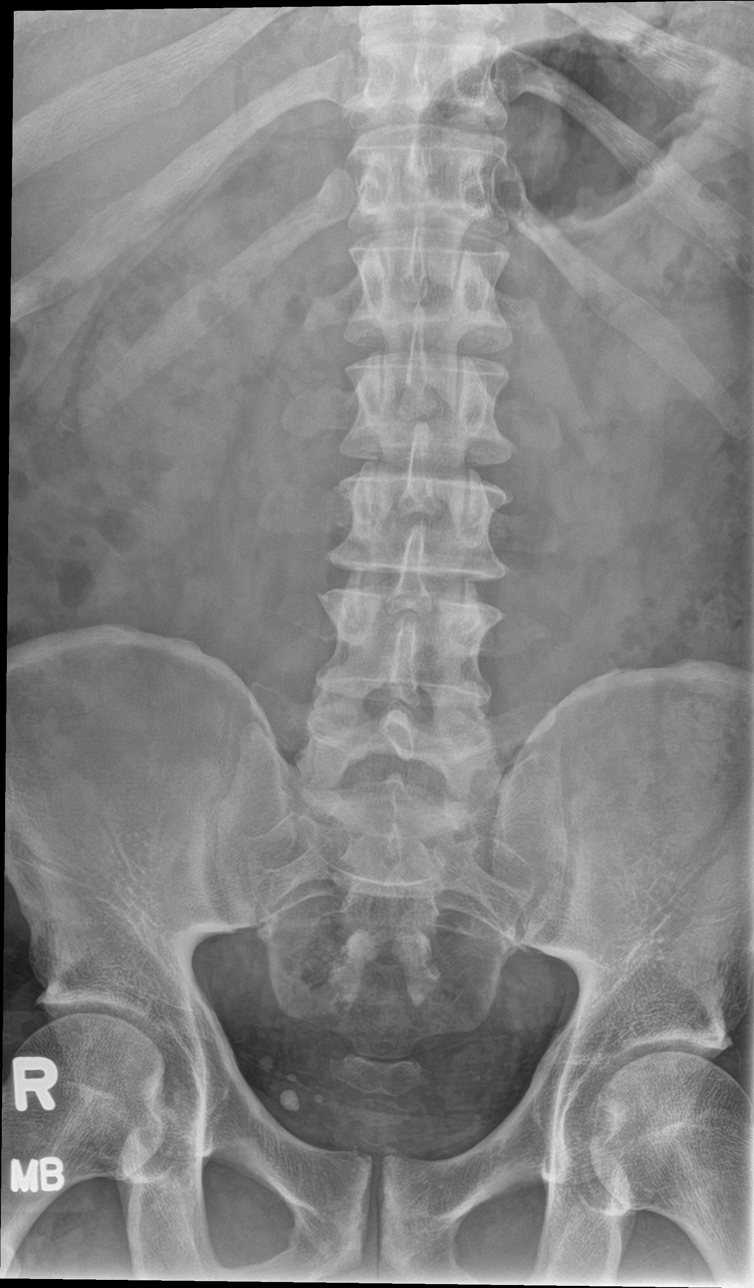

[l-spine obl (1 of 2)]
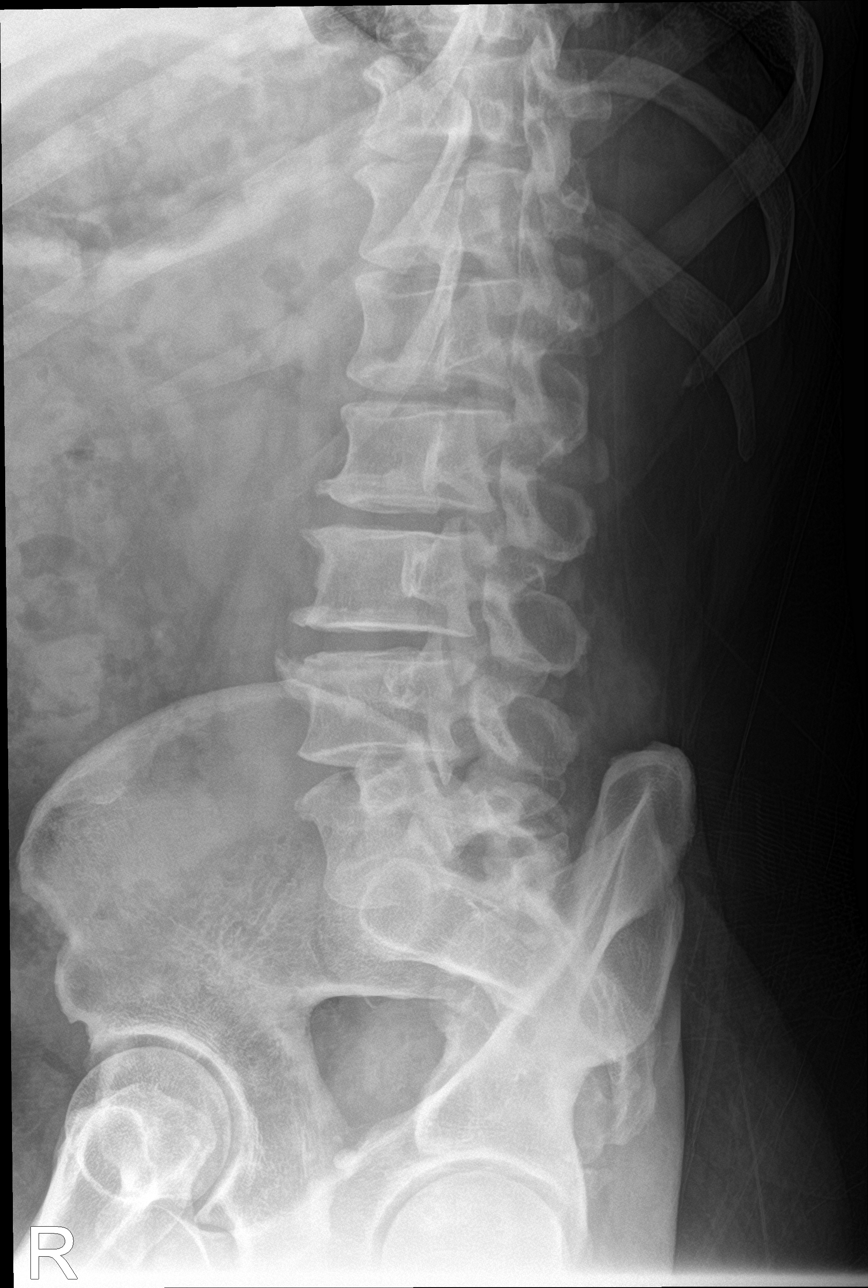

[l-spine obl (2 of 2)]
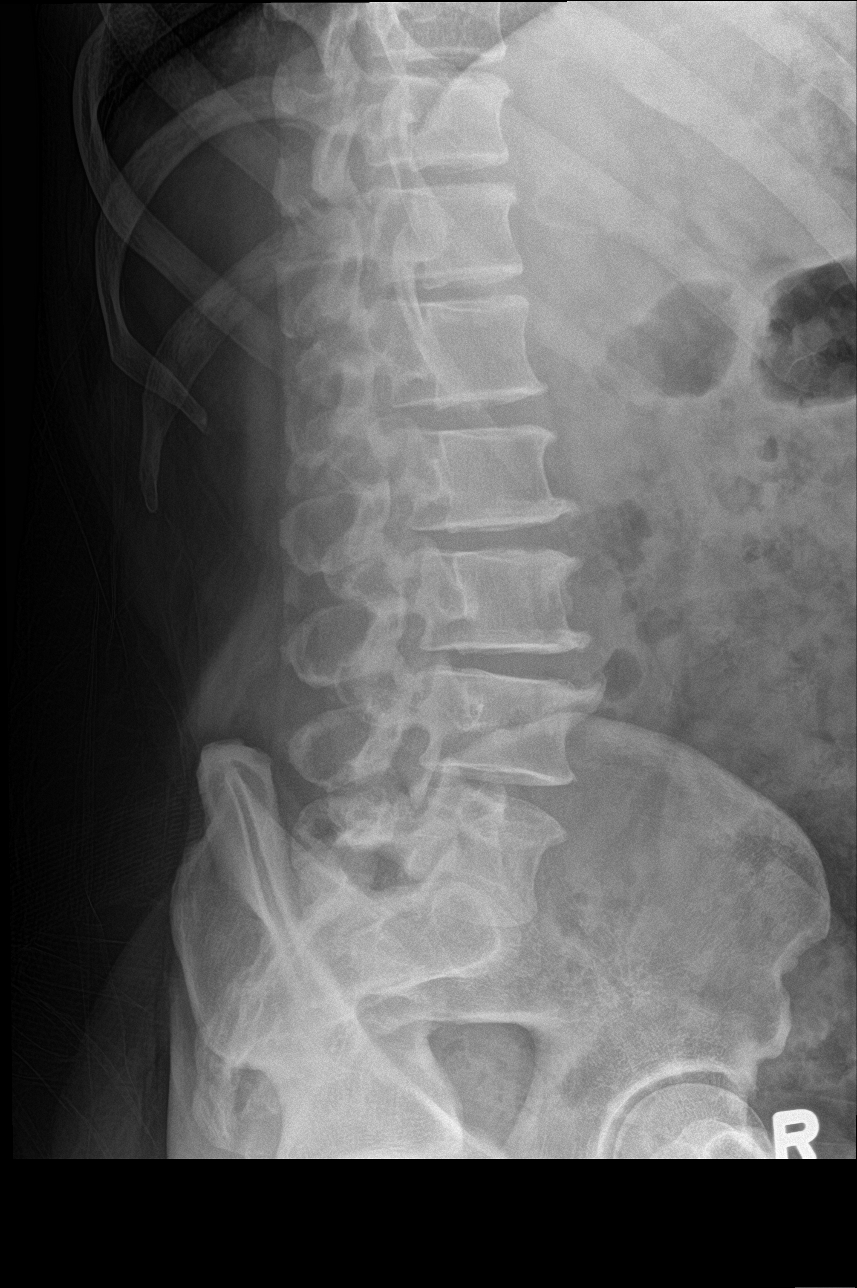

[l-spine lat]
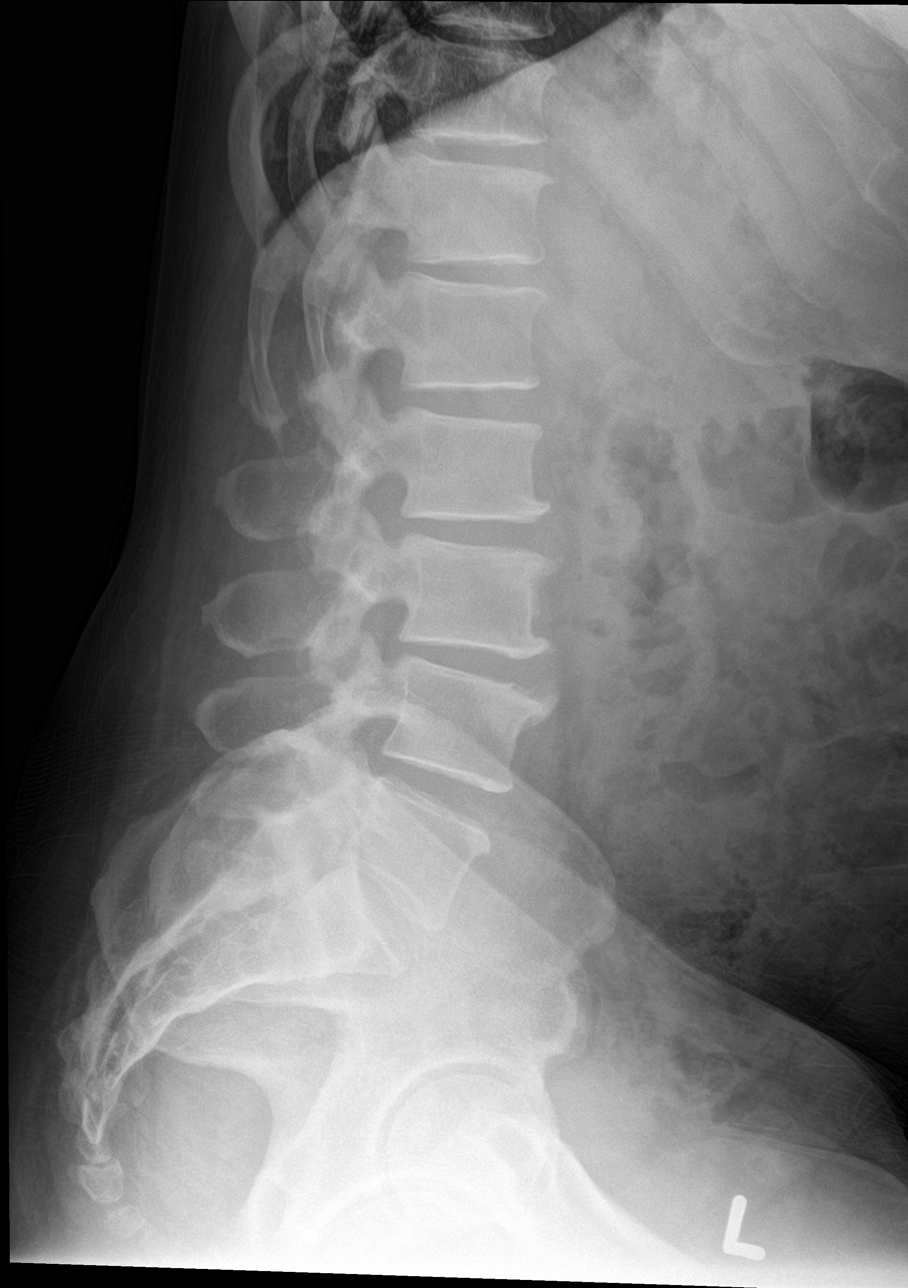

[l-spine spot]
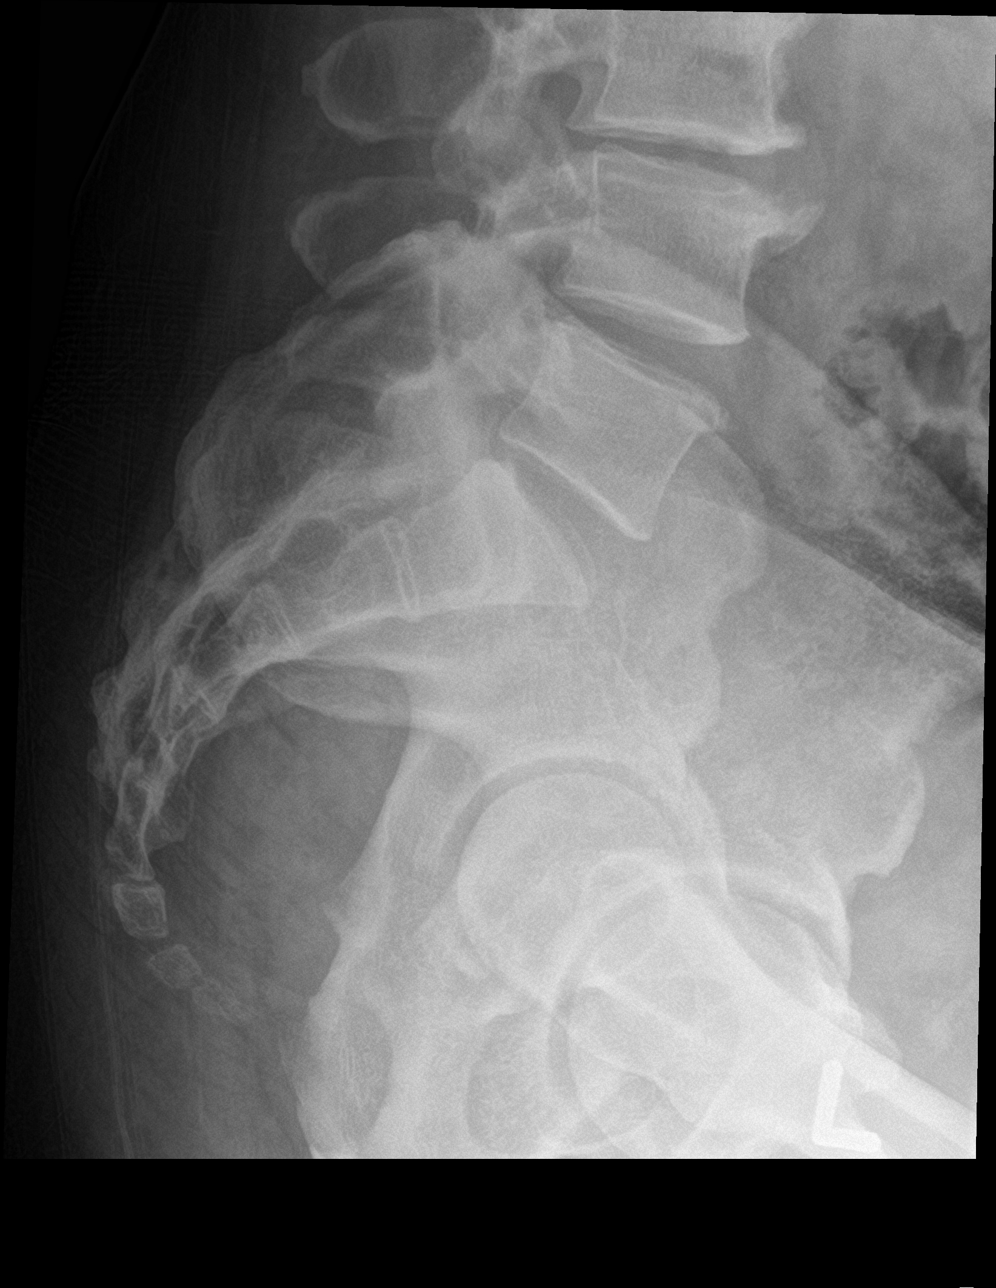

[5 of 5 positions shown; findings below may reference images not displayed]

FINDINGS: No fracture or spondylolisthesis is noted. Mild degenerative disc
disease is noted at L2-3 and L3-4 with anterior osteophyte
formation. Posterior facet joints appear normal.
IMPRESSION: Mild multilevel degenerative disc disease. No acute abnormality seen
in the lumbar spine.

## 2019-08-10 ENCOUNTER — Encounter (HOSPITAL_COMMUNITY): Payer: Self-pay | Admitting: Emergency Medicine

## 2019-08-10 ENCOUNTER — Emergency Department (HOSPITAL_COMMUNITY)
Admission: EM | Admit: 2019-08-10 | Discharge: 2019-08-10 | Disposition: A | Discharge status: Home / Self Care | Payer: PRIVATE HEALTH INSURANCE | Attending: Emergency Medicine | Admitting: Emergency Medicine

## 2019-08-10 ENCOUNTER — Other Ambulatory Visit: Payer: Self-pay

## 2019-08-10 DIAGNOSIS — Z79899 Other long term (current) drug therapy: Secondary | ICD-10-CM | POA: Diagnosis not present

## 2019-08-10 DIAGNOSIS — E876 Hypokalemia: Secondary | ICD-10-CM

## 2019-08-10 DIAGNOSIS — R7989 Other specified abnormal findings of blood chemistry: Secondary | ICD-10-CM | POA: Insufficient documentation

## 2019-08-10 DIAGNOSIS — Z89029 Acquired absence of unspecified finger(s): Secondary | ICD-10-CM | POA: Insufficient documentation

## 2019-08-10 DIAGNOSIS — M79604 Pain in right leg: Secondary | ICD-10-CM | POA: Diagnosis not present

## 2019-08-10 DIAGNOSIS — F17290 Nicotine dependence, other tobacco product, uncomplicated: Secondary | ICD-10-CM | POA: Insufficient documentation

## 2019-08-10 DIAGNOSIS — M79621 Pain in right upper arm: Secondary | ICD-10-CM | POA: Insufficient documentation

## 2019-08-10 DIAGNOSIS — X500XXA Overexertion from strenuous movement or load, initial encounter: Secondary | ICD-10-CM | POA: Diagnosis not present

## 2019-08-10 DIAGNOSIS — M79622 Pain in left upper arm: Secondary | ICD-10-CM | POA: Diagnosis not present

## 2019-08-10 DIAGNOSIS — Y9289 Other specified places as the place of occurrence of the external cause: Secondary | ICD-10-CM | POA: Diagnosis not present

## 2019-08-10 DIAGNOSIS — M791 Myalgia, unspecified site: Secondary | ICD-10-CM

## 2019-08-10 LAB — HEPATIC FUNCTION PANEL
ALT: 170 U/L — ABNORMAL HIGH (ref 0–44)
AST: 147 U/L — ABNORMAL HIGH (ref 15–41)
Albumin: 3.3 g/dL — ABNORMAL LOW (ref 3.5–5.0)
Alkaline Phosphatase: 61 U/L (ref 38–126)
Bilirubin, Direct: 0.4 mg/dL — ABNORMAL HIGH (ref 0.0–0.2)
Indirect Bilirubin: 0.8 mg/dL (ref 0.3–0.9)
Total Bilirubin: 1.2 mg/dL (ref 0.3–1.2)
Total Protein: 6 g/dL — ABNORMAL LOW (ref 6.5–8.1)

## 2019-08-10 LAB — CBC
HCT: 45.8 % (ref 39.0–52.0)
Hemoglobin: 15.8 g/dL (ref 13.0–17.0)
MCH: 32.4 pg (ref 26.0–34.0)
MCHC: 34.5 g/dL (ref 30.0–36.0)
MCV: 94 fL (ref 80.0–100.0)
Platelets: 311 10*3/uL (ref 150–400)
RBC: 4.87 MIL/uL (ref 4.22–5.81)
RDW: 12.1 % (ref 11.5–15.5)
WBC: 10.6 10*3/uL — ABNORMAL HIGH (ref 4.0–10.5)
nRBC: 0 % (ref 0.0–0.2)

## 2019-08-10 LAB — BASIC METABOLIC PANEL
Anion gap: 9 (ref 5–15)
BUN: 8 mg/dL (ref 6–20)
CO2: 23 mmol/L (ref 22–32)
Calcium: 8.3 mg/dL — ABNORMAL LOW (ref 8.9–10.3)
Chloride: 106 mmol/L (ref 98–111)
Creatinine, Ser: 0.93 mg/dL (ref 0.61–1.24)
GFR calc Af Amer: >60 mL/min (ref >60)
GFR calc non Af Amer: >60 mL/min (ref >60)
Glucose, Bld: 125 mg/dL — ABNORMAL HIGH (ref 70–99)
Potassium: 3.3 mmol/L — ABNORMAL LOW (ref 3.5–5.1)
Sodium: 138 mmol/L (ref 135–145)

## 2019-08-10 LAB — CK: Total CK: 112 U/L (ref 49–397)

## 2019-08-10 MED ORDER — METHOCARBAMOL 500 MG PO TABS
500.0000 mg | ORAL_TABLET | Freq: Once | ORAL | Status: AC
  Administered 2019-08-10: 500 mg via ORAL
  Filled 2019-08-10: qty 1

## 2019-08-10 MED ORDER — POTASSIUM CHLORIDE ER 10 MEQ PO TBCR: 10.0000 meq | EXTENDED_RELEASE_TABLET | Freq: Every day | ORAL | 0 refills | Status: DC

## 2019-08-10 NOTE — ED Triage Notes (Signed)
Pt here from home with c/o bil arm tightness along with pain in the right leg that has been ongoing over the weekend

## 2019-08-10 NOTE — Discharge Instructions (Signed)
Take potassium daily. Your low potassium lay be contributing to your symptoms.  Eat foots with high potassium (see paperwork).  Make sure you are staying well hydrated with water.  Decrease/stop your alcohol use. This is affecting your liver.  Follow-up with either of the clinics listed below to set up primary care, recheck your liver enzymes, and for further evaluation if your symptoms are not better.

## 2019-08-10 NOTE — ED Provider Notes (Signed)
[facility] DEPARTMENT Provider Note   CSN: [id number] Arrival date & time: 08/10/19  3335     History No chief complaint on file.   Jeremy Snow is a 54 y.o. male presenting for evaluation of bilateral arm and R leg pain.   Pt states yesterday morning he woke up with a tightness/ache in bilateral biceps.  Because of this pain, he had difficulty moving his arms, but this improved after taking ibuprofen.  He reports no pain in his distal upper extremities.  No pain in his neck or back.  He also reports pain of his right upper posterior leg.  He denies history of similar.  He denies numbness or tingling.  He denies fall, trauma, or injury.  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  He has not taken any medicine this morning.  He reports no medical problems, takes medications daily.  He smokes cigars daily.  He reports alcohol use, denies drug use.   HPI     No past medical history on file.  There are no problems to display for this patient.   Past Surgical History:  Procedure Laterality Date  . FINGER AMPUTATION         No family history on file.  Social History   Tobacco Use  . Smoking status: Current Every Day Smoker    Packs/day: 0.50    Types: Cigars  . Smokeless tobacco: Never Used  Substance Use Topics  . Alcohol use: No  . Drug use: No    Home Medications Prior to Admission medications   Medication Sig Start Date End Date Taking? Authorizing Provider  cyclobenzaprine (FLEXERIL) 10 MG tablet Take 1 tablet (10 mg total) by mouth 2 (two) times daily as needed for muscle spasms. 12/05/15   Konrad Felix, PA  HYDROcodone-acetaminophen (NORCO) 10-325 MG tablet Take 1 tablet by mouth every 6 (six) hours as needed. 12/05/15   Konrad Felix, PA  potassium chloride (KLOR-CON) 10 MEQ tablet Take 1 tablet (10 mEq total) by mouth daily for 7 days. 08/10/19 08/17/19  Elio Haden, PA-C  predniSONE  (DELTASONE) 10 MG tablet Take 2 tablets (20 mg total) by mouth daily. 12/05/15   Konrad Felix, PA    Allergies    Patient has no known allergies.  Review of Systems   Review of Systems  Musculoskeletal: Positive for myalgias. Negative for back pain.    Physical Exam Updated Vital Signs BP (!) 139/96 (BP Location: Right Arm)   Pulse 98   Temp 98.5 F (36.9 C) (Oral)   Resp 16   Ht 5' 3" (1.6 m)   Wt 70.3 kg   SpO2 96%   BMI 27.46 kg/m   Physical Exam Vitals and nursing note reviewed.  Constitutional:      General: He is not in acute distress.    Appearance: He is well-developed.     Comments: Appears nontoxic  HENT:     Head: Normocephalic and atraumatic.     Mouth/Throat:     Mouth: Mucous membranes are dry.     Comments: Appears slightly dehydrated Eyes:     Conjunctiva/sclera: Conjunctivae normal.     Pupils: Pupils are equal, round, and reactive to light.  Cardiovascular:     Rate and Rhythm: Normal rate and regular rhythm.     Pulses: Normal pulses.  Pulmonary:     Effort: Pulmonary effort is normal. No respiratory distress.  Breath sounds: Normal breath sounds. No wheezing.  Abdominal:     General: There is no distension.     Palpations: Abdomen is soft. There is no mass.     Tenderness: There is no abdominal tenderness. There is no guarding or rebound.  Musculoskeletal:        General: Normal range of motion.     Cervical back: Normal range of motion and neck supple.     Right lower leg: No edema.     Left lower leg: No edema.     Comments: No erythema, warmth, swelling, or induration noted of bilateral upper extremities.  No tenderness palpation over the biceps and cells.  Radial pulses 2+ bilaterally.  Grip strength intact bilaterally.  Strength and sensation of upper extremities equal bilaterally.   Skin:    General: Skin is warm and dry.     Capillary Refill: Capillary refill takes less than 2 seconds.  Neurological:     Mental Status: He is  alert and oriented to person, place, and time.     ED Results / Procedures / Treatments   Labs (all labs ordered are listed, but only abnormal results are displayed) Labs Reviewed  CBC - Abnormal; Notable for the following components:      Result Value   WBC 10.6 (*)    All other components within normal limits  BASIC METABOLIC PANEL - Abnormal; Notable for the following components:   Potassium 3.3 (*)    Glucose, Bld 125 (*)    Calcium 8.3 (*)    All other components within normal limits  HEPATIC FUNCTION PANEL - Abnormal; Notable for the following components:   Total Protein 6.0 (*)    Albumin 3.3 (*)    AST 147 (*)    ALT 170 (*)    Bilirubin, Direct 0.4 (*)    All other components within normal limits  CK    EKG None  Radiology No results found.  Procedures Procedures (including critical care time)  Medications Ordered in ED Medications  methocarbamol (ROBAXIN) tablet 500 mg (500 mg Oral Given 08/10/19 1031)    ED Course  I have reviewed the triage vital signs and the nursing notes.  Pertinent labs & imaging results that were available during my care of the patient were reviewed by me and considered in my medical decision making (see chart for details).    MDM Rules/Calculators/A&P                      Patient presenting for evaluation of 2 days of bilateral arm and right leg pain.  On exam, patient is neurovascularly intact.  No signs of infection.  Exam is not consistent with blood clots.  Patient states he cannot move his arms, however is actively flexing and extending his arms without difficulty.  No back or spine tenderness, doubt spinal cause.  Likely MSK pain due to his job.  Also consider dehydration.  Less likely rhabdo, however will obtain CK.  Labs interpreted by me, shows mild hypokalemia at 3.3.  Of note, LFTs are elevated, but not bili or alk phos.  Doubt gallbladder etiology.  Likely due to alcohol use.  Mild leukocytosis at 10, however exam is not  consistent with infection.  CK is normal, as such doubt rhabdo.  Discussed findings with patient.  Discussed importance of hydration, decreasing alcohol use, and taking potassium.  Encouraged follow-up with primary care for recheck of LFTs and for reevaluation of symptoms.    At this time, patient appears safe for discharge.  Return precautions given.  Patient states he understands and agrees to plan.  Final Clinical Impression(s) / ED Diagnoses Final diagnoses:  Myalgia  Elevated LFTs  Hypokalemia    Rx / DC Orders ED Discharge Orders         Ordered    potassium chloride (KLOR-CON) 10 MEQ tablet  Daily     08/10/19 1143           Franchot Heidelberg, PA-C 08/10/19 1203    Tegeler, Gwenyth Allegra, MD 08/10/19 1521

## 2020-02-10 ENCOUNTER — Other Ambulatory Visit: Payer: Self-pay

## 2020-02-10 ENCOUNTER — Ambulatory Visit (INDEPENDENT_AMBULATORY_CARE_PROVIDER_SITE_OTHER): Payer: No Typology Code available for payment source | Admitting: Podiatry

## 2020-02-10 ENCOUNTER — Encounter: Payer: Self-pay | Admitting: Podiatry

## 2020-02-10 DIAGNOSIS — M79674 Pain in right toe(s): Secondary | ICD-10-CM | POA: Diagnosis not present

## 2020-02-10 DIAGNOSIS — M79675 Pain in left toe(s): Secondary | ICD-10-CM

## 2020-02-10 DIAGNOSIS — L603 Nail dystrophy: Secondary | ICD-10-CM | POA: Diagnosis not present

## 2020-02-10 DIAGNOSIS — B351 Tinea unguium: Secondary | ICD-10-CM

## 2020-02-10 NOTE — Progress Notes (Signed)
   HPI: 54 y.o. male presenting today as a new patient for evaluation of painful symptomatic thickened discolored nails noted to the bilateral great toes.  Patient would like to have the nails avulsed today.  He states that they are very painful and thickened with hardening and they cause pain while walking and working in his work boots.  This is been like this for several years.  He presents to have treatment performed and for further treatment and evaluation  No past medical history on file.   Physical Exam: General: The patient is alert and oriented x3 in no acute distress.  Dermatology: Skin is warm, dry and supple bilateral lower extremities. Negative for open lesions or macerations.  Hyperkeratotic dystrophic discolored nails noted to the bilateral great toes.  There is some associated tenderness to palpation as well.  Vascular: Palpable pedal pulses bilaterally. No edema or erythema noted. Capillary refill within normal limits.  Neurological: Epicritic and protective threshold grossly intact bilaterally.   Musculoskeletal Exam: Range of motion within normal limits to all pedal and ankle joints bilateral. Muscle strength 5/5 in all groups bilateral.   Assessment: 1.  Dystrophic nails bilateral great toes 2.  Onychomycosis of toenail bilateral great toes   Plan of Care:  1. Patient evaluated.  Different treatment options were discussed with the patient today.  The patient would like to have the nails temporarily removed and allow for a new nail to grow in.  I did explain to the patient that there is a high likelihood the nails will just grow back dystrophic and thickened.  The patient consented to have his nails temporarily removed. 2.  The toes were prepped in aseptic manner and a digital block was performed to the bilateral great toenails consisting of 3 mL of 2% lidocaine plain.  The bilateral great toenails were completely avulsed in toto and light dressing was applied. 3.  Silvadene  cream provided for the patient.  Apply daily with a Band-Aid 4.  Return to clinic as needed.  If the nails do grow back thickened and symptomatic we may need to proceed with total permanent nail avulsions.  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 Felecia Shelling, DPM    [address] [place], [place] [address]                Office [phone]  Fax [phone]

## 2020-02-10 NOTE — Patient Instructions (Signed)

## 2022-09-01 ENCOUNTER — Other Ambulatory Visit: Payer: Self-pay

## 2022-09-01 ENCOUNTER — Encounter (HOSPITAL_COMMUNITY): Payer: Self-pay | Admitting: Emergency Medicine

## 2022-09-01 ENCOUNTER — Ambulatory Visit (HOSPITAL_COMMUNITY)
Admission: EM | Admit: 2022-09-01 | Discharge: 2022-09-01 | Disposition: A | Discharge status: Home / Self Care | Payer: Managed Care, Other (non HMO) | Attending: Emergency Medicine | Admitting: Emergency Medicine

## 2022-09-01 DIAGNOSIS — G8929 Other chronic pain: Secondary | ICD-10-CM

## 2022-09-01 DIAGNOSIS — M545 Low back pain, unspecified: Secondary | ICD-10-CM

## 2022-09-01 LAB — POCT URINALYSIS DIP (MANUAL ENTRY)
Glucose, UA: NEGATIVE mg/dL
Ketones, POC UA: NEGATIVE mg/dL
Leukocytes, UA: NEGATIVE
Nitrite, UA: NEGATIVE
Protein Ur, POC: 30 mg/dL — AB
Spec Grav, UA: >=1.03 — AB (ref 1.010–1.025)
Urobilinogen, UA: 0.2 E.U./dL
pH, UA: 5 (ref 5.0–8.0)

## 2022-09-01 MED ORDER — METHYLPREDNISOLONE SODIUM SUCC 125 MG IJ SOLR
INTRAMUSCULAR | Status: AC
  Filled 2022-09-01: qty 2

## 2022-09-01 MED ORDER — METHYLPREDNISOLONE SODIUM SUCC 125 MG IJ SOLR
80.0000 mg | Freq: Once | INTRAMUSCULAR | Status: AC
  Administered 2022-09-01: 80 mg via INTRAMUSCULAR

## 2022-09-01 MED ORDER — CYCLOBENZAPRINE HCL 10 MG PO TABS: 10.0000 mg | ORAL_TABLET | Freq: Two times a day (BID) | ORAL | 0 refills | Status: DC | PRN

## 2022-09-01 MED ORDER — KETOROLAC TROMETHAMINE 30 MG/ML IJ SOLN
INTRAMUSCULAR | Status: AC
  Filled 2022-09-01: qty 1

## 2022-09-01 MED ORDER — KETOROLAC TROMETHAMINE 30 MG/ML IJ SOLN
30.0000 mg | Freq: Once | INTRAMUSCULAR | Status: AC
  Administered 2022-09-01: 30 mg via INTRAMUSCULAR

## 2022-09-01 NOTE — ED Provider Notes (Signed)
[facility]    CSN: [id number] Arrival date & time: 09/01/22  1122      History   Chief Complaint Chief Complaint  Patient presents with   Flank Pain    Pt  complaining of left flank pain for the past 3 days with some burning sensation with urination.    HPI CHUN DANEY is a 57 y.o. male.   Patient presents to clinic for left-sided lower back pain for the past 4 days.  Initially the pain radiated down his left leg, reports this is since resolved.  Reports pain increases with laying down.  Pain is constant, does not increase with bending or twisting.  Denies any recent falls or trauma.  He does endorse burning at the end of urination, reports his urine is a dark yellow color.  He denies any fevers, nausea or vomiting.  He has been taking Tylenol and ibuprofen for the past 2 days, he recently had 7 teeth pulled.  Was not placed on antibiotics.    The history is provided by the patient and medical records.  Flank Pain Pertinent negatives include no chest pain and no abdominal pain.    History reviewed. No pertinent past medical history.  There are no problems to display for this patient.   Past Surgical History:  Procedure Laterality Date   FINGER AMPUTATION         Home Medications    Prior to Admission medications   Medication Sig Start Date End Date Taking? Authorizing Provider  cyclobenzaprine (FLEXERIL) 10 MG tablet Take 1 tablet (10 mg total) by mouth 2 (two) times daily as needed for muscle spasms. 09/01/22  Yes Rinaldo Ratel, [place] N, FNP  HYDROcodone-acetaminophen (NORCO) 10-325 MG tablet Take 1 tablet by mouth every 6 (six) hours as needed. 12/05/15   Tharon Aquas, PA  potassium chloride (KLOR-CON) 10 MEQ tablet Take 1 tablet (10 mEq total) by mouth daily for 7 days. 08/10/19 08/17/19  Caccavale, Sophia, PA-C  predniSONE (DELTASONE) 10 MG tablet Take 2 tablets (20 mg total) by mouth daily. 12/05/15   Tharon Aquas, PA    Family  History History reviewed. No pertinent family history.  Social History Social History   Tobacco Use   Smoking status: Every Day    Packs/day: .5    Types: Cigars, Cigarettes   Smokeless tobacco: Never  Substance Use Topics   Alcohol use: No   Drug use: No     Allergies   Patient has no known allergies.   Review of Systems Review of Systems  Constitutional:  Negative for fever.  Respiratory:  Negative for cough.   Cardiovascular:  Negative for chest pain.  Gastrointestinal:  Negative for abdominal pain, nausea and vomiting.  Genitourinary:  Positive for flank pain. Negative for dysuria, hematuria, penile discharge, penile pain, penile swelling, scrotal swelling and testicular pain.  Musculoskeletal:  Positive for back pain and gait problem.     Physical Exam Triage Vital Signs ED Triage Vitals  Enc Vitals Group     BP 09/01/22 1207 105/72     Pulse Rate 09/01/22 1207 (!) 102     Resp 09/01/22 1207 18     Temp 09/01/22 1207 98.2 F (36.8 C)     Temp Source 09/01/22 1207 Oral     SpO2 09/01/22 1207 99 %     Weight 09/01/22 1208 156 lb 8.4 oz (71 kg)     Height 09/01/22 1208 5\' 3"  (1.6 m)  Head Circumference --      Peak Flow --      Pain Score 09/01/22 1208 7     Pain Loc --      Pain Edu? --      Excl. in GC? --    No data found.  Updated Vital Signs BP 105/72 (BP Location: Right Arm)   Pulse (!) 102   Temp 98.2 F (36.8 C) (Oral)   Resp 18   Ht 5\' 3"  (1.6 m)   Wt 156 lb 8.4 oz (71 kg)   SpO2 99%   BMI 27.73 kg/m   Visual Acuity Right Eye Distance:   Left Eye Distance:   Bilateral Distance:    Right Eye Near:   Left Eye Near:    Bilateral Near:     Physical Exam Vitals and nursing note reviewed.  Constitutional:      Appearance: Normal appearance.  HENT:     Head: Normocephalic and atraumatic.     Right Ear: External ear normal.     Left Ear: External ear normal.     Nose: Nose normal.     Mouth/Throat:     Mouth: Mucous membranes  are moist.  Eyes:     Conjunctiva/sclera: Conjunctivae normal.  Cardiovascular:     Rate and Rhythm: Normal rate.  Pulmonary:     Effort: Pulmonary effort is normal. No respiratory distress.  Abdominal:     General: Abdomen is flat. Bowel sounds are normal. There is no distension.     Palpations: Abdomen is soft. There is no mass.     Tenderness: There is no abdominal tenderness. There is no right CVA tenderness, left CVA tenderness, guarding or rebound.     Hernia: No hernia is present.  Musculoskeletal:        General: Tenderness present. No swelling, deformity or signs of injury.     Cervical back: Normal.     Thoracic back: Normal.     Lumbar back: Tenderness present. No swelling, deformity or signs of trauma. Positive left straight leg raise test.       Back:     Comments: Left-sided lumbar back pain, TTP, no overlying rashes or skin changes.  No trauma or injury. + L SLR  Skin:    General: Skin is warm and dry.  Neurological:     General: No focal deficit present.     Mental Status: He is alert and oriented to person, place, and time.  Psychiatric:        Mood and Affect: Mood normal.        Behavior: Behavior normal. Behavior is cooperative.      UC Treatments / Results  Labs (all labs ordered are listed, but only abnormal results are displayed) Labs Reviewed  POCT URINALYSIS DIP (MANUAL ENTRY) - Abnormal; Notable for the following components:      Result Value   Color, UA orange (*)    Clarity, UA cloudy (*)    Bilirubin, UA small (*)    Spec Grav, UA >=1.030 (*)    Blood, UA small (*)    Protein Ur, POC =30 (*)    All other components within normal limits    EKG   Radiology No results found.  Procedures Procedures (including critical care time)  Medications Ordered in UC Medications  ketorolac (TORADOL) 30 MG/ML injection 30 mg (has no administration in time range)  methylPREDNISolone sodium succinate (SOLU-MEDROL) 125 mg/2 mL injection 80 mg (has  no administration in  time range)    Initial Impression / Assessment and Plan / UC Course  I have reviewed the triage vital signs and the nursing notes.  Pertinent labs & imaging results that were available during my care of the patient were reviewed by me and considered in my medical decision making (see chart for details).  Vitals and triage reviewed, patient is hemodynamically stable presents to clinic with left back pain, denies numbness or tingling.  Without incontinence or saddle anesthesia.  No injuries or trauma, deferred imaging at this point.  Reports some dysuria, negative for CVA tenderness.  Urinalysis negative for nitrates or leukocytes.  Suspect dehydration due to recent oral surgery.  Treated for musculoskeletal back pain, follow-up and return precautions discussed, no questions at this time.     Final Clinical Impressions(s) / UC Diagnoses   Final diagnoses:  Chronic left-sided low back pain without sciatica     Discharge Instructions      Your urine did not show signs of infection, however, it did show you are significantly dehydrated.  This may be due to your recent oral surgery.  Please ensure you are drinking at least 64 ounces of water daily.   Your back pain appears musculoskeletal in nature, we have given you an injection for pain in clinic.  You can take the muscle relaxers up to 2 times daily as needed for muscle spasms, do not drink or drive on these medications as they may make you drowsy.  Starting tomorrow, you can alternate between Tylenol and ibuprofen every 4-6 hours.  I recommend following up with [facility] if your back pain persists.  It is also important to have a primary care provider for your routine medical issues, you can reach out to [facility] community health and wellness to schedule.  Please return to clinic for any new or concerning symptoms.      ED Prescriptions     Medication Sig Dispense Auth. Provider   cyclobenzaprine  (FLEXERIL) 10 MG tablet Take 1 tablet (10 mg total) by mouth 2 (two) times daily as needed for muscle spasms. 20 tablet Oliviarose Punch, [place], [place]      PDMP not reviewed this encounter.   Yulonda Wheeling, [place] N, [place] 09/01/22 1252

## 2022-09-01 NOTE — Discharge Instructions (Signed)
Your urine did not show signs of infection, however, it did show you are significantly dehydrated.  This may be due to your recent oral surgery.  Please ensure you are drinking at least 64 ounces of water daily.   Your back pain appears musculoskeletal in nature, we have given you an injection for pain in clinic.  You can take the muscle relaxers up to 2 times daily as needed for muscle spasms, do not drink or drive on these medications as they may make you drowsy.  Starting tomorrow, you can alternate between Tylenol and ibuprofen every 4-6 hours.  I recommend following up with [facility] if your back pain persists.  It is also important to have a primary care provider for your routine medical issues, you can reach out to [facility] community health and wellness to schedule.  Please return to clinic for any new or concerning symptoms.

## 2022-09-01 NOTE — ED Triage Notes (Signed)
Pt  complaining of left flank pain for the past 3 days with some burning sensation with urination.

## 2022-09-07 ENCOUNTER — Encounter: Payer: Self-pay | Admitting: Family Medicine

## 2022-09-07 ENCOUNTER — Ambulatory Visit (INDEPENDENT_AMBULATORY_CARE_PROVIDER_SITE_OTHER): Payer: Managed Care, Other (non HMO)

## 2022-09-07 ENCOUNTER — Ambulatory Visit (INDEPENDENT_AMBULATORY_CARE_PROVIDER_SITE_OTHER): Payer: Managed Care, Other (non HMO) | Admitting: Family Medicine

## 2022-09-07 VITALS — BP 100/62 | HR 80 | Temp 97.8°F | Ht 64.5 in | Wt 142.2 lb

## 2022-09-07 DIAGNOSIS — M5442 Lumbago with sciatica, left side: Secondary | ICD-10-CM | POA: Diagnosis not present

## 2022-09-07 DIAGNOSIS — M5136 Other intervertebral disc degeneration, lumbar region: Secondary | ICD-10-CM

## 2022-09-07 MED ORDER — MELOXICAM 15 MG PO TABS
15.0000 mg | ORAL_TABLET | Freq: Every day | ORAL | 2 refills | Status: DC
Start: 2022-09-07 — End: 2023-02-08

## 2022-09-07 MED ORDER — METHOCARBAMOL 500 MG PO TABS
500.0000 mg | ORAL_TABLET | Freq: Four times a day (QID) | ORAL | 2 refills | Status: DC
Start: 2022-09-07 — End: 2023-02-08

## 2022-09-07 NOTE — Progress Notes (Unsigned)
New Patient Office Visit  Subjective    Patient ID: Jeremy Snow, male    DOB: 1965-11-20  Age: 57 y.o. MRN: [id number]  CC:  Chief Complaint  Patient presents with   Establish Care    HPI Jeremy Snow presents to establish care Patient reports he did not have a previous pcp. He is reporting a new symptom of a lump in his groin area, on the right side. States it came up about 10 days ago. Pt reports he wasn't doing anything at the time. States that its not hurting, just noticed it then. Did not feel a pop in the groin or anything like that. Reports that the lump "goes up and them comes down" in size. No problems urinating.   Pt is also reporting back pain, has been going on for about a week. Was seen in the urgent care for this pain as well. States they gave him 2 injections and a muscle relaxer, states that the muscle relaxer helped a little bit but the pain returned. Pt reports the pain hurts worse with standing. No leg weakness, does state that he occasionally feels like it is going down his left leg. This pain is new, has never had problems with his back before.   Pt denies any other diagnosed medical problems, does not take any medications regularly except tylenol and ibuprofen. States he did have his teeth pulled recently.   Current Outpatient Medications  Medication Instructions   cyclobenzaprine (FLEXERIL) 10 mg, Oral, 2 times daily PRN   meloxicam (MOBIC) 15 mg, Oral, Daily   methocarbamol (ROBAXIN) 500 mg, Oral, 4 times daily    History reviewed. No pertinent past medical history.  Past Surgical History:  Procedure Laterality Date   FINGER AMPUTATION      History reviewed. No pertinent family history.  Social History   Socioeconomic History   Marital status: Single    Spouse name: Not on file   Number of children: Not on file   Years of education: Not on file   Highest education level: Not on file  Occupational History   Not on file  Tobacco Use    Smoking status: Every Day    Types: Cigars   Smokeless tobacco: Never  Vaping Use   Vaping Use: Never used  Substance and Sexual Activity   Alcohol use: Not Currently   Drug use: No   Sexual activity: Not Currently  Other Topics Concern   Not on file  Social History Narrative   Not on file   Social Determinants of Health   Financial Resource Strain: Not on file  Food Insecurity: Not on file  Transportation Needs: Not on file  Physical Activity: Not on file  Stress: Not on file  Social Connections: Not on file  Intimate Partner Violence: Not on file    Review of Systems  All other systems reviewed and are negative.       Objective    BP 100/62 (BP Location: Right Arm, Patient Position: Sitting, Cuff Size: Normal)   Pulse 80   Temp 97.8 F (36.6 C) (Oral)   Ht 5' 4.5" (1.638 m)   Wt 142 lb 3.2 oz (64.5 kg)   SpO2 97%   BMI 24.03 kg/m   Physical Exam Vitals reviewed.  Constitutional:      Appearance: Normal appearance. He is well-groomed and normal weight.  Eyes:     Extraocular Movements: Extraocular movements intact.     Conjunctiva/sclera: Conjunctivae normal.  Neck:  Thyroid: No thyromegaly.  Cardiovascular:     Rate and Rhythm: Normal rate and regular rhythm.     Heart sounds: S1 normal and S2 normal. No murmur heard. Pulmonary:     Effort: Pulmonary effort is normal.     Breath sounds: Normal air entry. Wheezing present. No rales.  Musculoskeletal:     Right lower leg: No edema.     Left lower leg: No edema.  Neurological:     General: No focal deficit present.     Mental Status: He is alert and oriented to person, place, and time.     Gait: Gait is intact.  Psychiatric:        Mood and Affect: Mood and affect normal.     {Labs (Optional):23779}    Assessment & Plan:  Acute left-sided back pain with sciatica -     DG Lumbar Spine Complete; Future -     Meloxicam; Take 1 tablet (15 mg total) by mouth daily.  Dispense: 30 tablet;  Refill: 2 -     Methocarbamol; Take 1 tablet (500 mg total) by mouth 4 (four) times daily.  Dispense: 60 tablet; Refill: 2    No follow-ups on file.   Karie Georges, MD

## 2022-09-10 NOTE — Assessment & Plan Note (Signed)
Was seen in the ER for this about a week ago, will treat with muscle relaxers and start meloxicam once daily. I will obtain new x-rays to look for progression of the disc disease he had on a previous film. It is likely that he will need to see a pain management specialist if his pain continues over the next 3 months.

## 2023-02-08 ENCOUNTER — Encounter (HOSPITAL_COMMUNITY): Payer: Self-pay | Admitting: Emergency Medicine

## 2023-02-08 ENCOUNTER — Ambulatory Visit (HOSPITAL_COMMUNITY)
Admission: EM | Admit: 2023-02-08 | Discharge: 2023-02-08 | Disposition: A | Discharge status: Home / Self Care | Payer: Managed Care, Other (non HMO) | Attending: Physician Assistant | Admitting: Physician Assistant

## 2023-02-08 DIAGNOSIS — K4090 Unilateral inguinal hernia, without obstruction or gangrene, not specified as recurrent: Secondary | ICD-10-CM | POA: Diagnosis not present

## 2023-02-08 DIAGNOSIS — M79605 Pain in left leg: Secondary | ICD-10-CM | POA: Diagnosis not present

## 2023-02-08 MED ORDER — KETOROLAC TROMETHAMINE 30 MG/ML IJ SOLN
INTRAMUSCULAR | Status: AC
  Filled 2023-02-08: qty 1

## 2023-02-08 MED ORDER — METHOCARBAMOL 500 MG PO TABS: 500.0000 mg | ORAL_TABLET | Freq: Four times a day (QID) | ORAL | 0 refills | Status: AC

## 2023-02-08 MED ORDER — KETOROLAC TROMETHAMINE 30 MG/ML IJ SOLN: 30.0000 mg | Freq: Once | INTRAMUSCULAR | Status: DC

## 2023-02-08 MED ORDER — DICLOFENAC SODIUM 75 MG PO TBEC: 75.0000 mg | DELAYED_RELEASE_TABLET | Freq: Two times a day (BID) | ORAL | 0 refills | Status: AC

## 2023-02-08 MED ORDER — KETOROLAC TROMETHAMINE 30 MG/ML IJ SOLN
30.0000 mg | Freq: Once | INTRAMUSCULAR | Status: AC
  Administered 2023-02-08: 30 mg via INTRAMUSCULAR

## 2023-02-08 MED ORDER — METHOCARBAMOL 500 MG PO TABS: 500.0000 mg | ORAL_TABLET | Freq: Once | ORAL | Status: DC

## 2023-02-08 NOTE — ED Triage Notes (Signed)
Pt reports yesterday at work they had the fan on and then started having left upper leg pain.

## 2023-02-08 NOTE — ED Notes (Signed)
assisted patient to restroom via wheelchair

## 2023-02-08 NOTE — ED Provider Notes (Signed)
[facility]    CSN: [id number] Arrival date & time: 02/08/23  1318      History   Chief Complaint Chief Complaint  Patient presents with   Leg Pain    HPI Jeremy Snow is a 57 y.o. male.   Patient complains of pain in his left thigh.  Patient reports the pain began at work today after working under a fan.  Patient reports he did not have any injury.  He states that he cannot tolerate walking.  Patient reports that he cannot lift his leg without using his arms.  Complaints of a swollen area to his right groin.  Patient reports he was evaluated for this before and he was told that they would get back to him.  Patient reports pain in groin.  Patient denies any fever or chills he denies any neck or back pain.  Patient denies any abdominal pain.  The history is provided by the patient. No language interpreter was used.  Leg Pain   History reviewed. No pertinent past medical history.  Patient Active Problem List   Diagnosis Date Noted   Acute left-sided back pain with sciatica 09/07/2022    Past Surgical History:  Procedure Laterality Date   FINGER AMPUTATION         Home Medications    Prior to Admission medications   Medication Sig Start Date End Date Taking? Authorizing Provider  cyclobenzaprine (FLEXERIL) 10 MG tablet Take 1 tablet (10 mg total) by mouth 2 (two) times daily as needed for muscle spasms. 09/01/22   Garrison, [place] N, FNP  meloxicam (MOBIC) 15 MG tablet Take 1 tablet (15 mg total) by mouth daily. 09/07/22   Karie Georges, MD  methocarbamol (ROBAXIN) 500 MG tablet Take 1 tablet (500 mg total) by mouth 4 (four) times daily. 09/07/22   Karie Georges, MD    Family History No family history on file.  Social History Social History   Tobacco Use   Smoking status: Every Day    Types: Cigars   Smokeless tobacco: Never  Vaping Use   Vaping status: Never Used  Substance Use Topics   Alcohol use: Not Currently   Drug use: No      Allergies   Patient has no known allergies.   Review of Systems Review of Systems  Gastrointestinal:  Positive for abdominal pain.  Musculoskeletal:  Positive for myalgias.  All other systems reviewed and are negative.    Physical Exam Triage Vital Signs ED Triage Vitals  Encounter Vitals Group     BP 02/08/23 1423 121/80     Systolic BP Percentile --      Diastolic BP Percentile --      Pulse Rate 02/08/23 1423 87     Resp 02/08/23 1423 17     Temp 02/08/23 1423 98.5 F (36.9 C)     Temp Source 02/08/23 1423 Oral     SpO2 02/08/23 1423 95 %     Weight --      Height --      Head Circumference --      Peak Flow --      Pain Score 02/08/23 1421 10     Pain Loc --      Pain Education --      Exclude from Growth Chart --    No data found.  Updated Vital Signs BP 121/80 (BP Location: Left Arm)   Pulse 87   Temp 98.5 F (36.9 C) (Oral)  Resp 17   SpO2 95%   Visual Acuity Right Eye Distance:   Left Eye Distance:   Bilateral Distance:    Right Eye Near:   Left Eye Near:    Bilateral Near:     Physical Exam Vitals and nursing note reviewed.  Constitutional:      Appearance: He is well-developed.  HENT:     Head: Normocephalic.  Cardiovascular:     Rate and Rhythm: Normal rate.  Pulmonary:     Effort: Pulmonary effort is normal.  Abdominal:     General: There is no distension.  Musculoskeletal:        General: Normal range of motion.     Cervical back: Normal range of motion.     Comments: Tender left thigh, pain with range of motion.  Neurovascular neurosensory intact.  Hernia right groin.  Easily reduced.  Skin:    General: Skin is warm.  Neurological:     Mental Status: He is alert and oriented to person, place, and time.      UC Treatments / Results  Labs (all labs ordered are listed, but only abnormal results are displayed) Labs Reviewed - No data to display  EKG   Radiology No results found.  Procedures Procedures  (including critical care time)  Medications Ordered in UC Medications  ketorolac (TORADOL) 30 MG/ML injection 30 mg (has no administration in time range)    Initial Impression / Assessment and Plan / UC Course  I have reviewed the triage vital signs and the nursing notes.  Pertinent labs & imaging results that were available during my care of the patient were reviewed by me and considered in my medical decision making (see chart for details).     Patient given Toradol injection.  I suspect pain and left leg is musculoskeletal.  Patient is advised to follow-up with orthopedist for leg pain.  He is given the phone number for general surgery to see for his hernia. Final Clinical Impressions(s) / UC Diagnoses   Final diagnoses:  Hernia, inguinal, right   Discharge Instructions   None    ED Prescriptions   None    PDMP not reviewed this encounter. An After Visit Summary was printed and given to the patient.       Elson Areas, [place] 02/08/23 1610
